# Patient Record
Sex: Male | Born: 1980 | Race: Black or African American | Hispanic: No | Marital: Single | Smoking: Current every day smoker
Health system: Southern US, Community
[De-identification: ages and names within clinical notes are randomized; demographics above are authoritative.]

---

## 2003-03-05 ENCOUNTER — Emergency Department (HOSPITAL_COMMUNITY): Admission: EM | Admit: 2003-03-05 | Discharge: 2003-03-05 | Payer: Self-pay | Admitting: *Deleted

## 2003-03-23 ENCOUNTER — Emergency Department (HOSPITAL_COMMUNITY): Admission: EM | Admit: 2003-03-23 | Discharge: 2003-03-23 | Payer: Self-pay | Admitting: Emergency Medicine

## 2009-07-10 ENCOUNTER — Emergency Department (HOSPITAL_COMMUNITY): Admission: EM | Admit: 2009-07-10 | Discharge: 2009-07-10 | Payer: Self-pay | Admitting: Emergency Medicine

## 2009-08-27 ENCOUNTER — Emergency Department (HOSPITAL_COMMUNITY): Admission: EM | Admit: 2009-08-27 | Discharge: 2009-08-27 | Payer: Self-pay | Admitting: Emergency Medicine

## 2010-02-09 ENCOUNTER — Emergency Department (HOSPITAL_COMMUNITY): Admission: EM | Admit: 2010-02-09 | Discharge: 2010-02-09 | Payer: Self-pay | Admitting: Emergency Medicine

## 2010-04-06 ENCOUNTER — Emergency Department (HOSPITAL_COMMUNITY): Admission: EM | Admit: 2010-04-06 | Discharge: 2010-04-07 | Payer: Self-pay | Admitting: Emergency Medicine

## 2010-07-29 IMAGING — CR DG FEMUR 2V*L*
4 series · 4 of 4 positions shown · non-contrast
Comparison: None.

CLINICAL DATA: Left leg pain.

LEFT FEMUR - 2 VIEW

[view not recorded (1 of 4)]
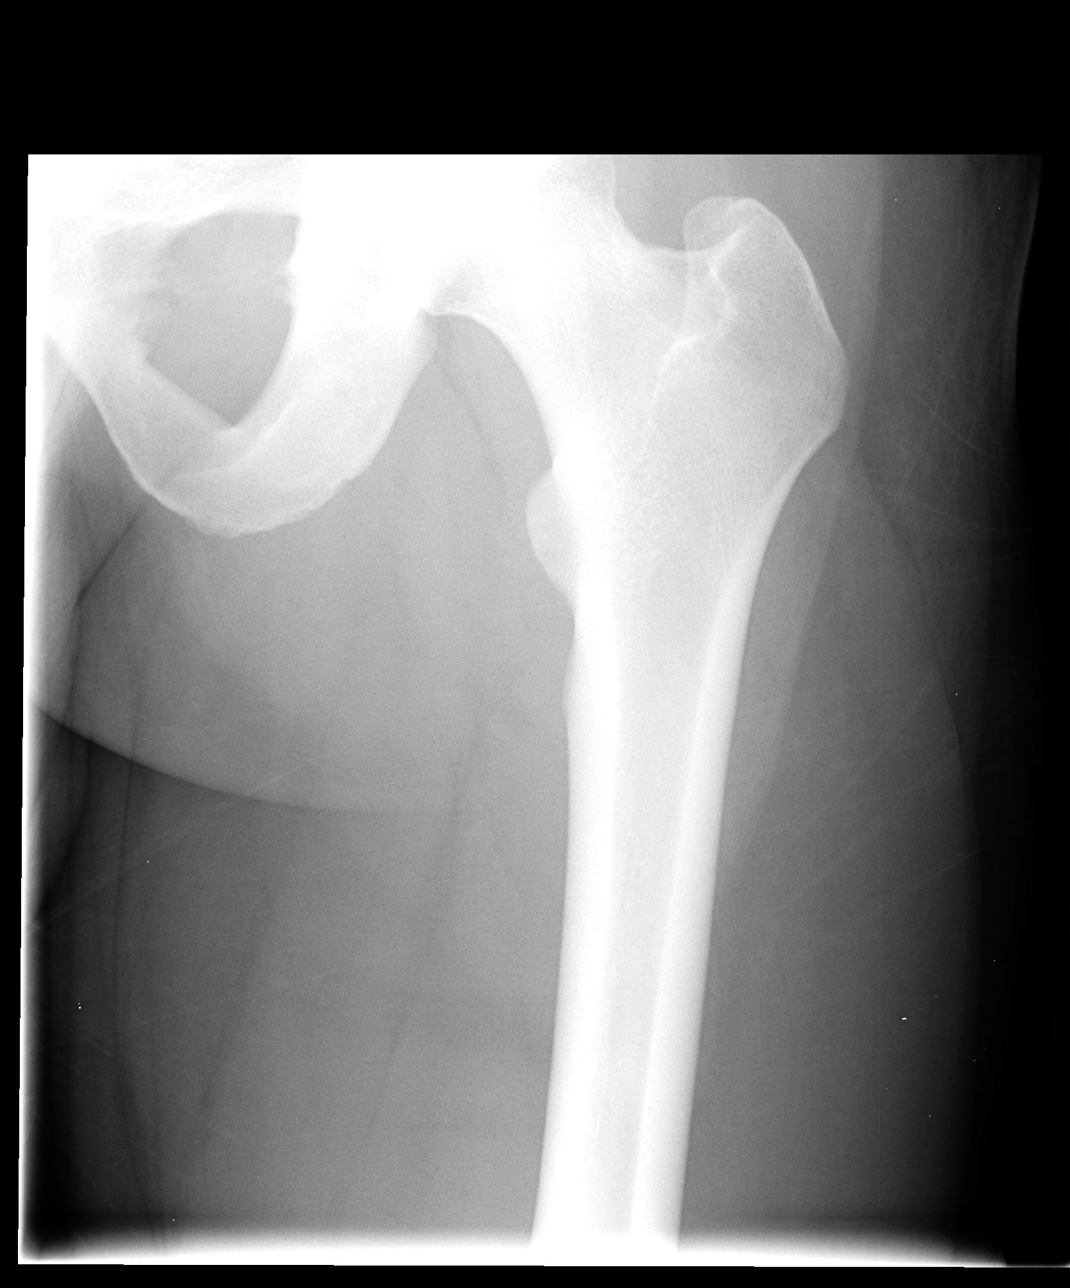

[view not recorded (2 of 4)]
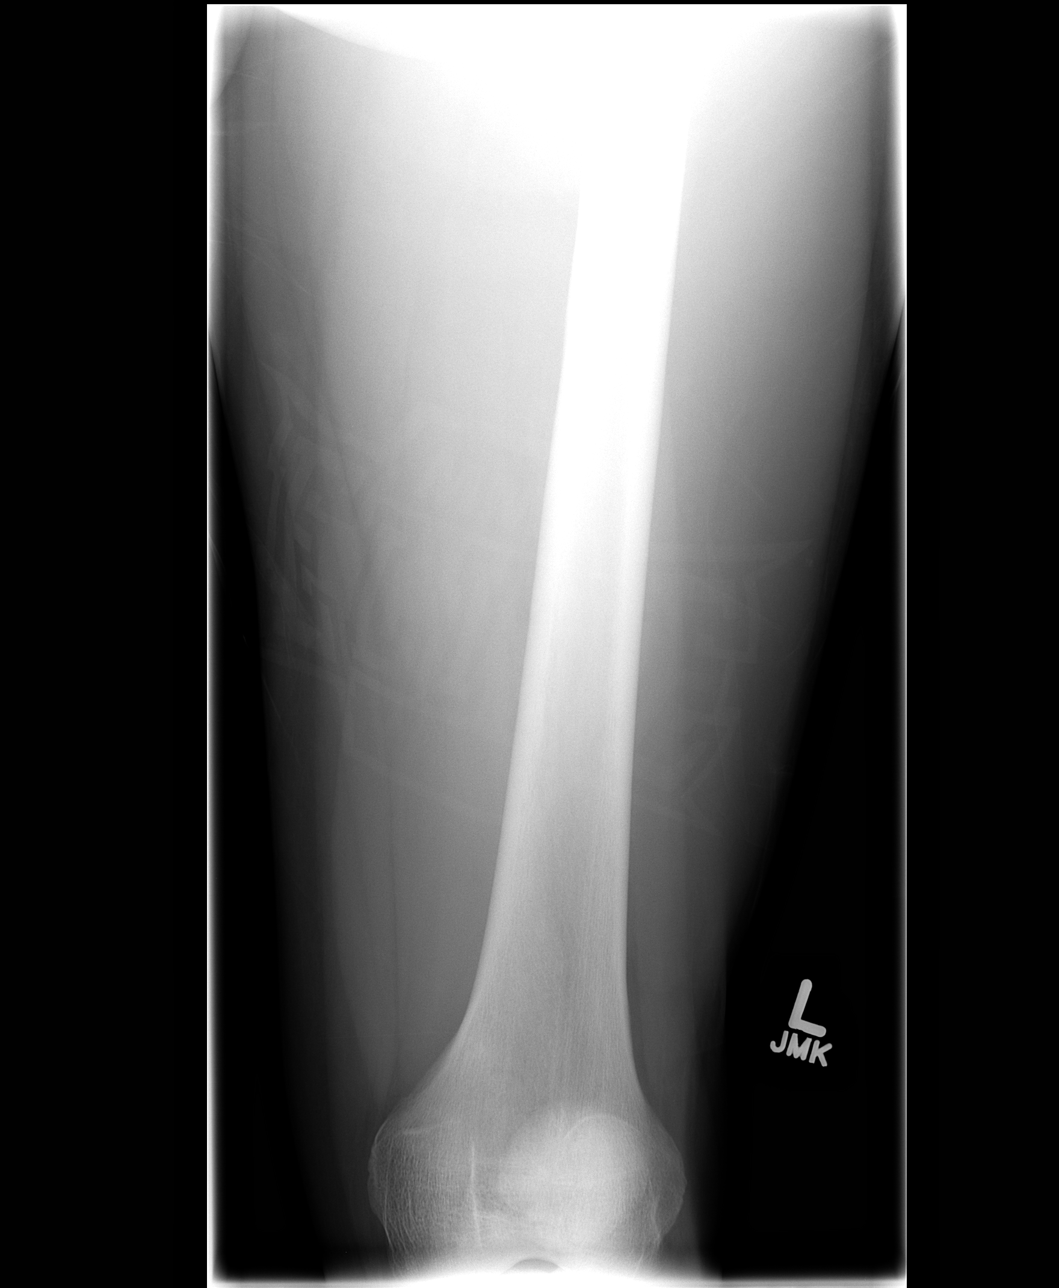

[view not recorded (3 of 4)]
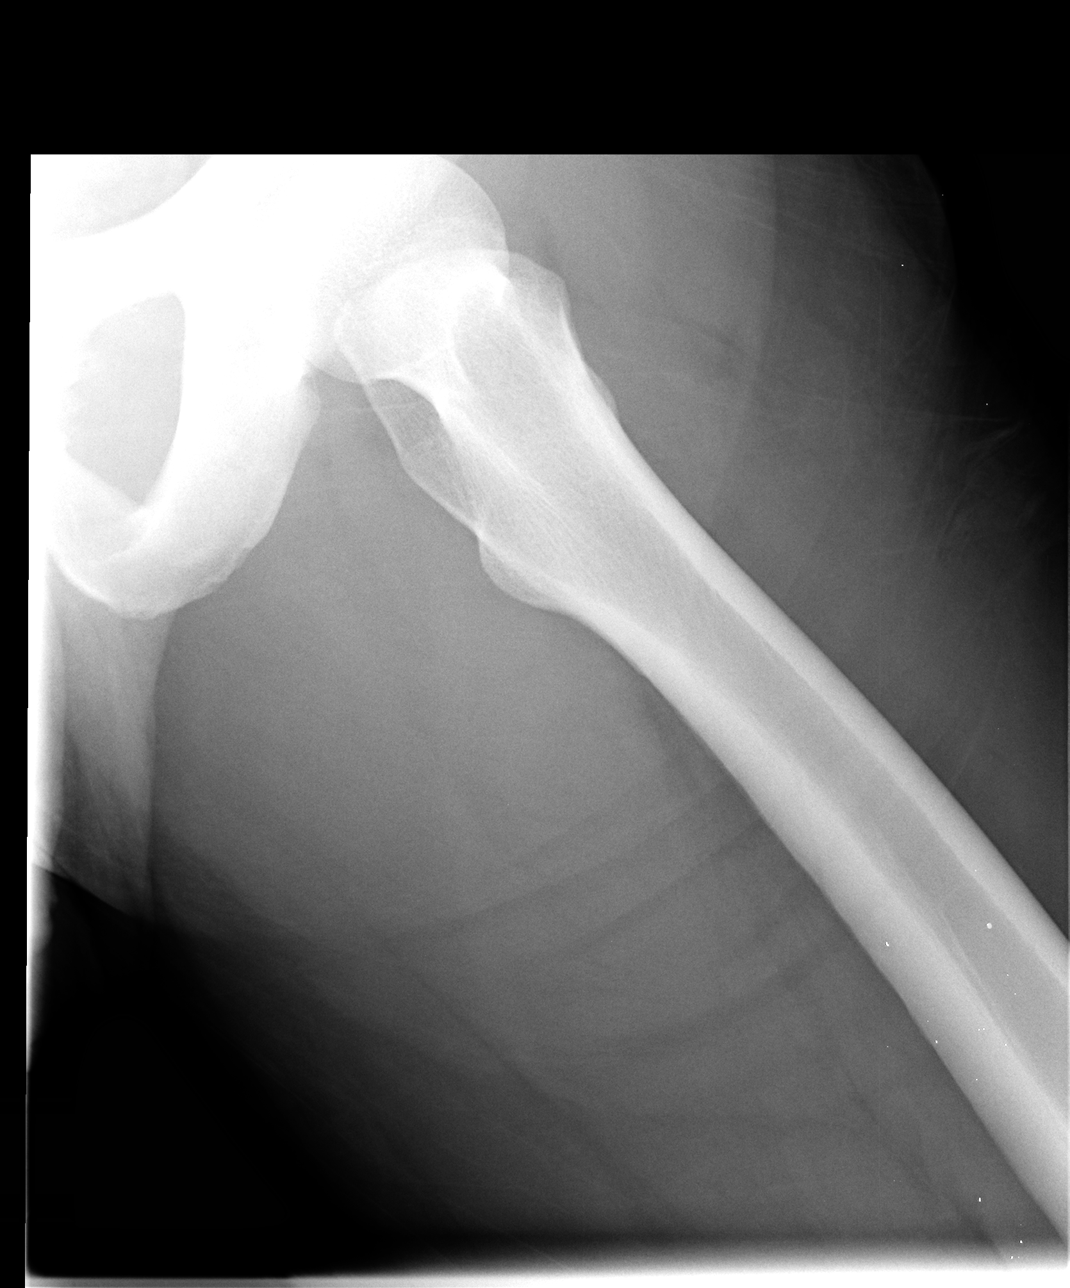

[view not recorded (4 of 4)]
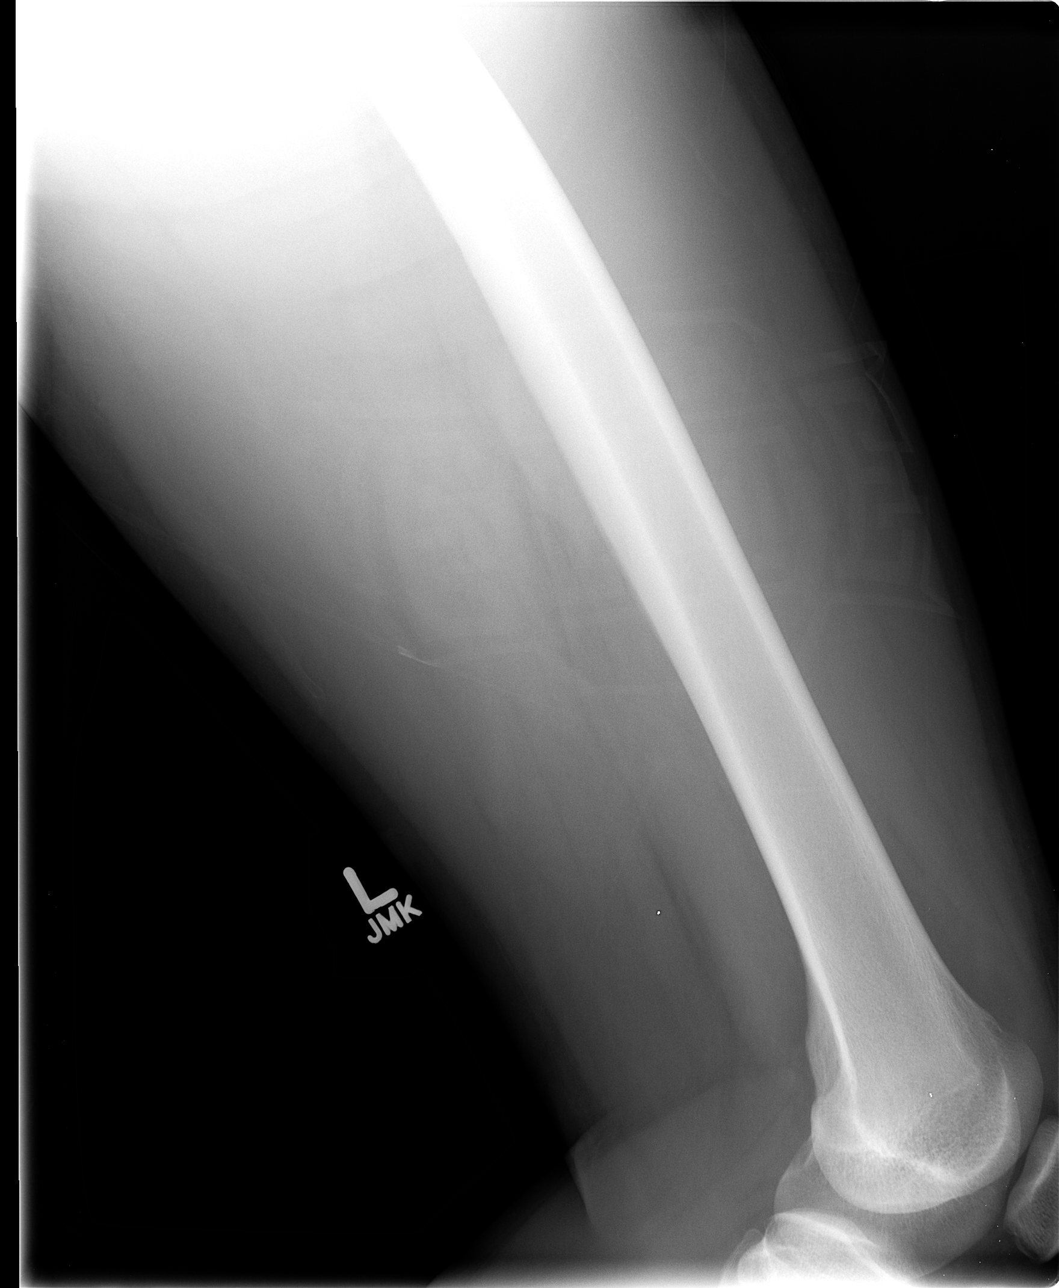

[4 of 4 positions shown; findings below may reference images not displayed]

FINDINGS: Imaged bones, joints and soft tissues appear normal.
IMPRESSION: Negative exam.

## 2010-12-31 ENCOUNTER — Emergency Department (HOSPITAL_COMMUNITY)
Admission: EM | Admit: 2010-12-31 | Discharge: 2011-01-01 | Payer: Self-pay | Source: Home / Self Care | Admitting: Emergency Medicine

## 2011-01-05 LAB — CBC
HCT: 42.8 % (ref 39.0–52.0)
Hemoglobin: 14.9 g/dL (ref 13.0–17.0)
MCH: 28 pg (ref 26.0–34.0)
MCHC: 34.8 g/dL (ref 30.0–36.0)
MCV: 80.3 fL (ref 78.0–100.0)
Platelets: 290 10*3/uL (ref 150–400)
RBC: 5.33 MIL/uL (ref 4.22–5.81)
RDW: 13 % (ref 11.5–15.5)
WBC: 4.5 10*3/uL (ref 4.0–10.5)

## 2011-01-05 LAB — DIFFERENTIAL
Basophils Absolute: 0 10*3/uL (ref 0.0–0.1)
Basophils Relative: 1 % (ref 0–1)
Eosinophils Absolute: 0.1 10*3/uL (ref 0.0–0.7)
Eosinophils Relative: 3 % (ref 0–5)
Lymphocytes Relative: 38 % (ref 12–46)

## 2011-01-05 LAB — RAPID URINE DRUG SCREEN, HOSP PERFORMED
Amphetamines: NOT DETECTED
Barbiturates: NOT DETECTED
Benzodiazepines: NOT DETECTED
Cocaine: NOT DETECTED
Opiates: NOT DETECTED
Tetrahydrocannabinol: NOT DETECTED

## 2011-01-05 LAB — BASIC METABOLIC PANEL
BUN: 16 mg/dL (ref 6–23)
Calcium: 9.4 mg/dL (ref 8.4–10.5)
Chloride: 103 mEq/L (ref 96–112)
Creatinine, Ser: 1.37 mg/dL (ref 0.4–1.5)
GFR calc non Af Amer: >60 mL/min (ref >60)

## 2011-01-05 LAB — URINALYSIS, ROUTINE W REFLEX MICROSCOPIC
Hgb urine dipstick: NEGATIVE
Protein, ur: NEGATIVE mg/dL
Specific Gravity, Urine: 1.025 (ref 1.005–1.030)
pH: 6 (ref 5.0–8.0)

## 2013-05-10 ENCOUNTER — Emergency Department (HOSPITAL_COMMUNITY)
Admission: EM | Admit: 2013-05-10 | Discharge: 2013-05-10 | Disposition: A | Discharge status: Home / Self Care | Payer: Self-pay | Attending: Emergency Medicine | Admitting: Emergency Medicine

## 2013-05-10 ENCOUNTER — Encounter (HOSPITAL_COMMUNITY): Payer: Self-pay | Admitting: Emergency Medicine

## 2013-05-10 DIAGNOSIS — Y9389 Activity, other specified: Secondary | ICD-10-CM | POA: Insufficient documentation

## 2013-05-10 DIAGNOSIS — R21 Rash and other nonspecific skin eruption: Secondary | ICD-10-CM | POA: Insufficient documentation

## 2013-05-10 DIAGNOSIS — R110 Nausea: Secondary | ICD-10-CM | POA: Insufficient documentation

## 2013-05-10 DIAGNOSIS — W57XXXA Bitten or stung by nonvenomous insect and other nonvenomous arthropods, initial encounter: Secondary | ICD-10-CM

## 2013-05-10 DIAGNOSIS — Z87891 Personal history of nicotine dependence: Secondary | ICD-10-CM | POA: Insufficient documentation

## 2013-05-10 DIAGNOSIS — IMO0001 Reserved for inherently not codable concepts without codable children: Secondary | ICD-10-CM | POA: Insufficient documentation

## 2013-05-10 DIAGNOSIS — S90569A Insect bite (nonvenomous), unspecified ankle, initial encounter: Secondary | ICD-10-CM | POA: Insufficient documentation

## 2013-05-10 DIAGNOSIS — Y929 Unspecified place or not applicable: Secondary | ICD-10-CM | POA: Insufficient documentation

## 2013-05-10 MED ORDER — DOXYCYCLINE HYCLATE 100 MG PO CAPS: 100.0000 mg | ORAL_CAPSULE | Freq: Two times a day (BID) | ORAL | Status: DC

## 2013-05-10 NOTE — ED Notes (Signed)
Pt c/o multiple bites to legs from spider, pt also c/o pulled off 3 ticks yesterday. Pt states he "feels funny". nad noted.

## 2013-05-14 NOTE — ED Provider Notes (Signed)
History     CSN: [id number]  Arrival date & time 05/10/13  1018   First MD Initiated Contact with Patient 05/10/13 1052      Chief Complaint  Patient presents with  . Insect Bite    (Consider location/radiation/quality/duration/timing/severity/associated sxs/prior treatment) HPI Comments: JUNG YURCHAK is a 32 y.o. male who presents to the Emergency Department complaining of  Several ticks bites to his lower legs and insect bites that he believes may have been spider bites.  He c/o generalized body aches and nausea.  He denies fever, abd pain, rash, vomiting or fever.  He has not applied any OTC creams or taken any medications  The history is provided by the patient.    History reviewed. No pertinent past medical history.  History reviewed. No pertinent past surgical history.  No family history on file.  History  Substance Use Topics  . Smoking status: Former Games developer  . Smokeless tobacco: Not on file  . Alcohol Use: No     Comment: rare      Review of Systems  Constitutional: Negative for fever, chills, activity change and appetite change.  HENT: Negative for sore throat, facial swelling, trouble swallowing, neck pain and neck stiffness.   Respiratory: Negative for chest tightness, shortness of breath and wheezing.   Gastrointestinal: Positive for nausea. Negative for vomiting.  Musculoskeletal: Positive for myalgias. Negative for joint swelling, arthralgias and gait problem.  Skin: Positive for rash. Negative for wound.  Neurological: Negative for dizziness, weakness, numbness and headaches.  All other systems reviewed and are negative.    Allergies  Review of patient's allergies indicates not on file.  Home Medications   Current Outpatient Rx  Name  Route  Sig  Dispense  Refill  . doxycycline (VIBRAMYCIN) 100 MG capsule   Oral   Take 1 capsule (100 mg total) by mouth 2 (two) times daily. For 14 days   28 capsule   0     BP 123/77  Pulse 76   Temp(Src) 98.6 F (37 C) (Oral)  Resp 16  Ht 5\' 11"  (1.803 m)  Wt 198 lb (89.812 kg)  BMI 27.63 kg/m2  SpO2 100%  Physical Exam  Nursing note and vitals reviewed. Constitutional: He is oriented to person, place, and time. He appears well-developed and well-nourished. No distress.  HENT:  Head: Normocephalic and atraumatic.  Mouth/Throat: Oropharynx is clear and moist.  Neck: Normal range of motion. Neck supple.  Cardiovascular: Normal rate, regular rhythm and normal heart sounds.   Pulmonary/Chest: Effort normal and breath sounds normal. No respiratory distress. He exhibits no tenderness.  Musculoskeletal: Normal range of motion. He exhibits no edema and no tenderness.  Lymphadenopathy:    He has no cervical adenopathy.  Neurological: He is alert and oriented to person, place, and time. He exhibits normal muscle tone. Coordination normal.  Skin: Rash noted. There is erythema.  Scattered Erythematous papules to the bilateral LE's.  No pustules or fluctuance.  No petechiae or vesicles    ED Course  Procedures (including critical care time)  Labs Reviewed - No data to display No results found.   1. Insect bite   2. Tick bite       MDM     Patient with hx of tick bite.  Denies rash, but c/o body aches and nausea at this time.  Non-toxic appearing   Will treat with doxy and he agrees to return here if sx's worsen       Dorthey Depace L.  Jerzie Bieri, PA-C 05/14/13 1418

## 2013-05-15 NOTE — ED Provider Notes (Signed)
Medical screening examination/treatment/procedure(s) were performed by non-physician practitioner and as supervising physician I was immediately available for consultation/collaboration.  Rhet Rorke, MD 05/15/13 1418 

## 2013-10-18 ENCOUNTER — Emergency Department (HOSPITAL_COMMUNITY)
Admission: EM | Admit: 2013-10-18 | Discharge: 2013-10-18 | Disposition: A | Discharge status: Home / Self Care | Payer: Self-pay | Attending: Emergency Medicine | Admitting: Emergency Medicine

## 2013-10-18 ENCOUNTER — Encounter (HOSPITAL_COMMUNITY): Payer: Self-pay | Admitting: Emergency Medicine

## 2013-10-18 ENCOUNTER — Telehealth: Payer: Self-pay | Admitting: Orthopedic Surgery

## 2013-10-18 ENCOUNTER — Emergency Department (HOSPITAL_COMMUNITY): Payer: Self-pay

## 2013-10-18 DIAGNOSIS — F17200 Nicotine dependence, unspecified, uncomplicated: Secondary | ICD-10-CM | POA: Insufficient documentation

## 2013-10-18 DIAGNOSIS — IMO0002 Reserved for concepts with insufficient information to code with codable children: Secondary | ICD-10-CM | POA: Insufficient documentation

## 2013-10-18 DIAGNOSIS — S63501A Unspecified sprain of right wrist, initial encounter: Secondary | ICD-10-CM

## 2013-10-18 DIAGNOSIS — W2209XA Striking against other stationary object, initial encounter: Secondary | ICD-10-CM | POA: Insufficient documentation

## 2013-10-18 DIAGNOSIS — Y939 Activity, unspecified: Secondary | ICD-10-CM | POA: Insufficient documentation

## 2013-10-18 DIAGNOSIS — Y929 Unspecified place or not applicable: Secondary | ICD-10-CM | POA: Insufficient documentation

## 2013-10-18 MED ORDER — IBUPROFEN 800 MG PO TABS: 800.0000 mg | ORAL_TABLET | Freq: Three times a day (TID) | ORAL | Status: DC

## 2013-10-18 MED ORDER — HYDROCODONE-ACETAMINOPHEN 5-325 MG PO TABS: ORAL_TABLET | ORAL | Status: DC

## 2013-10-18 NOTE — Telephone Encounter (Signed)
Dakota Everett called this morning to request an appointment for an [facility] ER follow-up. Said his injury is right forearm sprain.  He does not have any   Insurance so I advised him of the minimum amount ($125.00) needed for his first visit.  He said OK, he will call back to schedule

## 2013-10-18 NOTE — ED Notes (Signed)
Pt reports log rolling onto his right forearm last week, cont. To have pain and swelling to the area.

## 2013-10-18 NOTE — ED Notes (Signed)
Pt alert & oriented x4, stable gait. Patient given discharge instructions, paperwork & prescription(s). Patient  instructed to stop at the registration desk to finish any additional paperwork. Patient verbalized understanding. Pt left department w/ no further questions. 

## 2013-10-20 NOTE — ED Provider Notes (Signed)
CSN: [id number]     Arrival date & time 10/18/13  0830 History   First MD Initiated Contact with Patient 10/18/13 [phone]     Chief Complaint  Patient presents with  . Arm Injury   (Consider location/radiation/quality/duration/timing/severity/associated sxs/prior Treatment) Patient is a 32 y.o. male presenting with arm injury. The history is provided by the patient.  Arm Injury Location:  Arm Time since incident:  1 week Injury: yes   Mechanism of injury comment:  Direct blow from a log Arm location:  R forearm Pain details:    Quality:  Aching and throbbing   Radiates to:  Does not radiate   Severity:  Moderate   Onset quality:  Sudden   Timing:  Constant   Progression:  Unchanged Chronicity:  New Handedness:  Right-handed Dislocation: no   Foreign body present:  No foreign bodies Prior injury to area:  No Relieved by:  Rest and being still Worsened by:  Movement (gripping objects and supation of the arm) Ineffective treatments:  NSAIDs Associated symptoms: swelling   Associated symptoms: no decreased range of motion, no fever, no muscle weakness, no neck pain, no numbness, no stiffness and no tingling     History reviewed. No pertinent past medical history. History reviewed. No pertinent past surgical history. No family history on file. History  Substance Use Topics  . Smoking status: Current Every Day Smoker    Types: Cigarettes  . Smokeless tobacco: Not on file  . Alcohol Use: Yes     Comment: rare    Review of Systems  Constitutional: Negative for fever and chills.  Genitourinary: Negative for dysuria and difficulty urinating.  Musculoskeletal: Positive for arthralgias and joint swelling. Negative for neck pain and stiffness.       Right forearm pain  Skin: Negative for color change and wound.  All other systems reviewed and are negative.    Allergies  Review of patient's allergies indicates no known allergies.  Home Medications   Current Outpatient Rx    Name  Route  Sig  Dispense  Refill  . doxycycline (VIBRAMYCIN) 100 MG capsule   Oral   Take 1 capsule (100 mg total) by mouth 2 (two) times daily. For 14 days   28 capsule   0   . HYDROcodone-acetaminophen (NORCO/VICODIN) 5-325 MG per tablet      Take one-two tabs po q 4-6 hrs prn pain   15 tablet   0   . ibuprofen (ADVIL,MOTRIN) 800 MG tablet   Oral   Take 1 tablet (800 mg total) by mouth 3 (three) times daily.   21 tablet   0    BP 125/79  Pulse 62  Temp(Src) 98.1 F (36.7 C) (Oral)  Resp 20  Ht 5\' 11"  (1.803 m)  Wt 192 lb (87.091 kg)  BMI 26.79 kg/m2  SpO2 99% Physical Exam  Nursing note and vitals reviewed. Constitutional: He is oriented to person, place, and time. He appears well-developed and well-nourished. No distress.  HENT:  Head: Normocephalic and atraumatic.  Cardiovascular: Normal rate, regular rhythm and normal heart sounds.   Pulmonary/Chest: Effort normal and breath sounds normal.  Musculoskeletal: He exhibits edema and tenderness.       Right forearm: He exhibits tenderness and swelling. He exhibits no deformity and no laceration.       Arms: Localized  ttp of the distal right forearm.  Mild to moderate STS.  Radial pulse is brisk, distal sensation intact.  CR< 2 sec.  No bruising  or bony deformity.  Right elbow and wrist are NT. Compartments soft.  Neurological: He is alert and oriented to person, place, and time. He exhibits normal muscle tone. Coordination normal.  Skin: Skin is warm and dry.    ED Course  Procedures (including critical care time) Labs Review Labs Reviewed - No data to display Imaging Review Dg Forearm Right  10/18/2013   CLINICAL DATA:  Log fell on forearm, pain and swelling just above wrist  EXAM: RIGHT FOREARM - 2 VIEW  COMPARISON:  None  FINDINGS: Osseous mineralization normal.  Joint spaces preserved.  No fracture, dislocation, or bone destruction.  IMPRESSION: Normal exam.   Electronically Signed   By: Ulyses Southward M.D.    On: 10/18/2013 09:54    EKG Interpretation   None       MDM   1. Forearm sprain, right, initial encounter    Localized ttp of the right distal forearm.  No bony deformity.  NV intact.  Pt has full ROM of the fingers.  Neg XR.  Possible ligament injury vs muscle tear.  Compartments are soft.    Velcro forearm splint applied.  Pain improved.  Pt agrees to RICE therapy and close f/u with Dr. Romeo Apple.     Bowman Higbie L. Trisha Mangle, PA-C 10/20/13 1611

## 2013-10-20 NOTE — ED Provider Notes (Signed)
Medical screening examination/treatment/procedure(s) were performed by non-physician practitioner and as supervising physician I was immediately available for consultation/collaboration.  EKG Interpretation   None         Charles B. Sheldon, MD 10/20/13 2030 

## 2013-10-25 ENCOUNTER — Encounter: Payer: Self-pay | Admitting: Orthopedic Surgery

## 2013-11-01 ENCOUNTER — Ambulatory Visit (INDEPENDENT_AMBULATORY_CARE_PROVIDER_SITE_OTHER): Payer: Self-pay | Admitting: Orthopedic Surgery

## 2013-11-01 ENCOUNTER — Encounter: Payer: Self-pay | Admitting: Orthopedic Surgery

## 2013-11-01 VITALS — BP 122/84 | Ht 71.0 in | Wt 212.0 lb

## 2013-11-01 DIAGNOSIS — S40021A Contusion of right upper arm, initial encounter: Secondary | ICD-10-CM

## 2013-11-01 DIAGNOSIS — S40029A Contusion of unspecified upper arm, initial encounter: Secondary | ICD-10-CM

## 2013-11-01 NOTE — Patient Instructions (Signed)
Work note to return to work 11/02/13

## 2013-11-01 NOTE — Progress Notes (Signed)
Patient ID: Dakota Everett, male   DOB: December 09, 1981, 32 y.o.   MRN: [id number]  Chief Complaint  Patient presents with  . 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 But his right elbow forearm wrist and hand have normal range of motion stability and strength no neurovascular deficits  Impression contusion right forearm  Followup as needed return to work tomorrow.

## 2014-04-16 ENCOUNTER — Emergency Department (HOSPITAL_COMMUNITY)
Admission: EM | Admit: 2014-04-16 | Discharge: 2014-04-16 | Disposition: A | Discharge status: Home / Self Care | Payer: Self-pay | Attending: Emergency Medicine | Admitting: Emergency Medicine

## 2014-04-16 ENCOUNTER — Encounter (HOSPITAL_COMMUNITY): Payer: Self-pay | Admitting: Emergency Medicine

## 2014-04-16 ENCOUNTER — Emergency Department (HOSPITAL_COMMUNITY): Payer: Self-pay

## 2014-04-16 DIAGNOSIS — S92153A Displaced avulsion fracture (chip fracture) of unspecified talus, initial encounter for closed fracture: Secondary | ICD-10-CM

## 2014-04-16 DIAGNOSIS — Z791 Long term (current) use of non-steroidal anti-inflammatories (NSAID): Secondary | ICD-10-CM | POA: Insufficient documentation

## 2014-04-16 DIAGNOSIS — Y92838 Other recreation area as the place of occurrence of the external cause: Secondary | ICD-10-CM

## 2014-04-16 DIAGNOSIS — S93609A Unspecified sprain of unspecified foot, initial encounter: Secondary | ICD-10-CM | POA: Insufficient documentation

## 2014-04-16 DIAGNOSIS — F17200 Nicotine dependence, unspecified, uncomplicated: Secondary | ICD-10-CM | POA: Insufficient documentation

## 2014-04-16 DIAGNOSIS — Z792 Long term (current) use of antibiotics: Secondary | ICD-10-CM | POA: Insufficient documentation

## 2014-04-16 DIAGNOSIS — S93601A Unspecified sprain of right foot, initial encounter: Secondary | ICD-10-CM

## 2014-04-16 DIAGNOSIS — Y9239 Other specified sports and athletic area as the place of occurrence of the external cause: Secondary | ICD-10-CM | POA: Insufficient documentation

## 2014-04-16 DIAGNOSIS — S92109B Unspecified fracture of unspecified talus, initial encounter for open fracture: Secondary | ICD-10-CM | POA: Insufficient documentation

## 2014-04-16 DIAGNOSIS — W1801XA Striking against sports equipment with subsequent fall, initial encounter: Secondary | ICD-10-CM | POA: Insufficient documentation

## 2014-04-16 DIAGNOSIS — Y9361 Activity, american tackle football: Secondary | ICD-10-CM | POA: Insufficient documentation

## 2014-04-16 MED ORDER — TRAMADOL HCL 50 MG PO TABS: 100.0000 mg | ORAL_TABLET | Freq: Four times a day (QID) | ORAL | Status: DC | PRN

## 2014-04-16 MED ORDER — IBUPROFEN 800 MG PO TABS
800.0000 mg | ORAL_TABLET | Freq: Once | ORAL | Status: AC
  Administered 2014-04-16: 800 mg via ORAL
  Filled 2014-04-16: qty 1

## 2014-04-16 NOTE — Discharge Instructions (Signed)
Elevate your foot. Use ice packs for pain and to get the swelling down. Take ibuprofen 600 mg + acetaminophen 1000 mg 4 times a day for pain with the tramadol. Use the crutches until you are able to walk on your foot. Use the ankle support and shoe to support your foot and ankle. Call Dr Sanjuan DameKeeling's office to have him recheck your foot later this week.

## 2014-04-16 NOTE — ED Notes (Signed)
CSM intact after application of ACE and ASO.

## 2014-04-16 NOTE — ED Provider Notes (Signed)
CSN: [id number]     Arrival date & time 04/16/14  0729 History   This chart was scribed for Ward GivensIva L Mrytle Bento, MD by Ladona Ridgelaylor Day, ED scribe. This patient was seen in room APA17/APA17 and the patient's care was started at 0729.  Chief Complaint  Patient presents with  . Ankle Pain   The history is provided by the patient. No language interpreter was used.   HPI Comments: Dakota Everett is a 33 y.o. male who presents to the Emergency Department for sudden onset left foot pain after he injured it while playing football yesterday in a league. He states he was running and ran into another playing head on. The other player was knocked unconscious. He had some pain in his right foot after the fall. But he states woke up this morning and tried to go to work but heard a crack in his foot when he tried walking and the pain was too bad to continue walking.  He states cannot do ROM in his ankle because of pain. He localizes pain to dorsal aspect of his foot. He denies previous foot/ankle injuries.    He has no PCP   He smokes one black&mild per day  History reviewed. No pertinent past medical history. History reviewed. No pertinent past surgical history. No family history on file. History  Substance Use Topics  . Smoking status: Current Every Day Smoker    Types: Cigarettes  . Smokeless tobacco: Not on file  . Alcohol Use: Yes     Comment: rare  employed Smokes 1 cigar a day  Review of Systems  Constitutional: Negative for fever and chills.  Respiratory: Negative for cough and shortness of breath.   Cardiovascular: Negative for chest pain.  Gastrointestinal: Negative for abdominal pain.  Musculoskeletal: Negative for back pain.       Right foot pain  All other systems reviewed and are negative.  Allergies  Review of patient's allergies indicates no known allergies.  Home Medications   Prior to Admission medications   Medication Sig Start Date End Date Taking? Authorizing Provider   doxycycline (VIBRAMYCIN) 100 MG capsule Take 1 capsule (100 mg total) by mouth 2 (two) times daily. For 14 days 05/10/13   Tammy L. Triplett, PA-C  HYDROcodone-acetaminophen (NORCO/VICODIN) 5-325 MG per tablet Take one-two tabs po q 4-6 hrs prn pain 10/18/13   Tammy L. Triplett, PA-C  ibuprofen (ADVIL,MOTRIN) 800 MG tablet Take 1 tablet (800 mg total) by mouth 3 (three) times daily. 10/18/13   Tammy L. Triplett, PA-C   Triage Vitals: BP 142/80  Pulse 88  Resp 16  Ht 5\' 11"  (1.803 m)  Wt 216 lb (97.977 kg)  BMI 30.14 kg/m2  SpO2 99%  Vital signs normal    Physical Exam  Nursing note and vitals reviewed. Constitutional: He is oriented to person, place, and time. He appears well-developed and well-nourished. No distress.  HENT:  Head: Normocephalic and atraumatic.  Right Ear: External ear normal.  Left Ear: External ear normal.  Eyes: Conjunctivae are normal. Pupils are equal, round, and reactive to light. Right eye exhibits no discharge. Left eye exhibits no discharge.  Neck: Normal range of motion. Neck supple.  Pulmonary/Chest: Effort normal. No respiratory distress.  Musculoskeletal: Normal range of motion. He exhibits edema and tenderness.  Non tender over the malleoli of his right ankle both medially and laterally but does have round area of swelling/redness over the proximal foot of the base of the MTs of the little and  fourth toe. See Photo. Good distal pulses, sensation intact. States he is unable to move his toes b/o pain  Neurological: He is alert and oriented to person, place, and time.  Skin: Skin is warm and dry.  Psychiatric: He has a normal mood and affect. Thought content normal.      ED Course  Procedures (including critical care time)  Medications  ibuprofen (ADVIL,MOTRIN) tablet 800 mg (800 mg Oral Given 04/16/14 0833)    DIAGNOSTIC STUDIES: Oxygen Saturation is 99% on room air, normal by my interpretation.    COORDINATION OF CARE: At 820 AM Discussed  treatment plan with patient which includes right ankle/foot X-ray, ibuprofen, apply ice and ace wrap. Patient agrees.   Patient placed in a ASO and postop shoe. He was put on crutches. 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 He was given a work note for limited duty. On review of his prior visits patient has seen Dr. Romeo AppleHarrison in the past.  Labs Review Labs Reviewed - No data to display  Imaging Review Dg Ankle Complete Right  04/16/2014   CLINICAL DATA:  Ankle pain.  Redness and swelling.  EXAM: RIGHT ANKLE - COMPLETE 3+ VIEW  COMPARISON:  07/10/2009  FINDINGS: There is no evidence of acute fracture or dislocation. Joint spaces are preserved. No lytic or blastic osseous lesion is seen. No soft tissue abnormality is identified.  IMPRESSION: Negative.   Electronically Signed   By: Sebastian AcheAllen  Grady   On: 04/16/2014 08:01   Dg Foot Complete Right  04/16/2014   CLINICAL DATA:  Right foot and ankle pain.  EXAM: RIGHT FOOT COMPLETE - 3+ VIEW  COMPARISON:  04/16/2014 ankle  FINDINGS: The alignment of the foot is normal. There is a small ossification or bone fragment along the dorsal and lateral aspect of the talus. Findings could represent a small avulsion injury. No other areas are suspicious for a fracture. Soft tissues are prominent along the dorsal aspect of the foot.  IMPRESSION: Concern for a small bone fragment and avulsion injury along the dorsal and lateral aspect of the talus. Recommend clinical correlation in this area.   Electronically Signed   By: Richarda OverlieAdam  Henn M.D.   On: 04/16/2014 08:04     EKG Interpretation None      MDM   Final diagnoses:  Sprain of foot, right  Avulsion fracture of talus    Discharge Medication List as of 04/16/2014  8:41 AM    START taking these medications   Details  traMADol (ULTRAM) 50 MG tablet Take 2 tablets (100 mg total) by mouth every 6 (six)  hours as needed., Starting 04/16/2014, Until Discontinued, Print        Plan discharge   Devoria AlbeIva Christien Berthelot, MD, FACEP   I personally performed the services described in this documentation, which was scribed in my presence. The recorded information has been reviewed and considered.  Devoria AlbeIva Delsie Amador, MD, FACEP     Ward GivensIva L Domanick Cuccia, MD 04/16/14 [phone]

## 2014-04-16 NOTE — ED Notes (Signed)
Rt ankle/foot pain after playing football yesterday and reports other team player cleat went into pt foot. Redness and swelling noted to rt foot/ankle. Pt reports unable to move last 3 toes on right foot and unable to bear weight. Pt reports hearing a "crack" last night

## 2014-08-12 ENCOUNTER — Encounter (HOSPITAL_COMMUNITY): Payer: Self-pay | Admitting: Emergency Medicine

## 2014-08-12 ENCOUNTER — Emergency Department (HOSPITAL_COMMUNITY)
Admission: EM | Admit: 2014-08-12 | Discharge: 2014-08-12 | Disposition: A | Discharge status: Home / Self Care | Payer: Self-pay | Attending: Emergency Medicine | Admitting: Emergency Medicine

## 2014-08-12 DIAGNOSIS — S39012A Strain of muscle, fascia and tendon of lower back, initial encounter: Secondary | ICD-10-CM

## 2014-08-12 DIAGNOSIS — M545 Low back pain, unspecified: Secondary | ICD-10-CM | POA: Insufficient documentation

## 2014-08-12 DIAGNOSIS — F17200 Nicotine dependence, unspecified, uncomplicated: Secondary | ICD-10-CM | POA: Insufficient documentation

## 2014-08-12 DIAGNOSIS — M62830 Muscle spasm of back: Secondary | ICD-10-CM

## 2014-08-12 DIAGNOSIS — M62838 Other muscle spasm: Secondary | ICD-10-CM | POA: Insufficient documentation

## 2014-08-12 MED ORDER — HYDROCODONE-ACETAMINOPHEN 5-325 MG PO TABS: 1.0000 | ORAL_TABLET | ORAL | Status: DC | PRN

## 2014-08-12 MED ORDER — HYDROCODONE-ACETAMINOPHEN 5-325 MG PO TABS
1.0000 | ORAL_TABLET | Freq: Once | ORAL | Status: AC
  Administered 2014-08-12: 1 via ORAL
  Filled 2014-08-12: qty 1

## 2014-08-12 MED ORDER — NAPROXEN 500 MG PO TABS: 500.0000 mg | ORAL_TABLET | Freq: Two times a day (BID) | ORAL | Status: AC

## 2014-08-12 MED ORDER — CYCLOBENZAPRINE HCL 10 MG PO TABS
10.0000 mg | ORAL_TABLET | Freq: Once | ORAL | Status: AC
  Administered 2014-08-12: 10 mg via ORAL
  Filled 2014-08-12: qty 1

## 2014-08-12 MED ORDER — CYCLOBENZAPRINE HCL 10 MG PO TABS: 10.0000 mg | ORAL_TABLET | Freq: Two times a day (BID) | ORAL | Status: DC | PRN

## 2014-08-12 NOTE — ED Provider Notes (Signed)
CSN: [id number]     Arrival date & time 08/12/14  2140 History   First MD Initiated Contact with Patient 08/12/14 2227     Chief Complaint  Patient presents with  . Back Pain     (Consider location/radiation/quality/duration/timing/severity/associated sxs/prior Treatment) Patient is a 33 y.o. male presenting with back pain. The history is provided by the patient.  Back Pain Location:  Lumbar spine Quality:  Aching and shooting Pain severity:  Severe Onset quality:  Sudden Duration:  4 days Timing:  Constant Progression:  Worsening Chronicity:  New Context: physical stress   Relieved by:  Nothing Worsened by:  Movement, ambulation and bending Ineffective treatments:  Ibuprofen Associated symptoms: no bladder incontinence and no bowel incontinence    Dakota Everett is a 33 y.o. male who presents to the ED with low back pain. The pain started while he was at work and picking up a 100 pound piece of equipment.he felt a pop in his lower back and felt a wave of nausea. He went to Urgent Care when the injury occurred and was treated with muscle relaxant and told he could take ibuprofen. The patient has continue to have pain and has been unable to work. He went in but had to leave due to the pain.   History reviewed. No pertinent past medical history. History reviewed. No pertinent past surgical history. No family history on file. History  Substance Use Topics  . Smoking status: Current Every Day Smoker    Types: Cigarettes, Cigars  . Smokeless tobacco: Not on file  . Alcohol Use: Yes     Comment: rare    Review of Systems  Gastrointestinal: Negative for bowel incontinence.  Genitourinary: Negative for bladder incontinence.  Musculoskeletal: Positive for back pain.  all other systems negative    Allergies  Other and Tomato  Home Medications   Prior to Admission medications   Medication Sig Start Date End Date Taking? Authorizing Provider  Multiple Vitamin  (MULTIVITAMIN WITH MINERALS) TABS tablet Take 1 tablet by mouth daily.   Yes Historical Provider, MD   BP 141/94  Pulse 64  Temp(Src) 99.3 F (37.4 C)  Resp 20  Ht  (1.803 m)  Wt 196 lb (88.905 kg)  BMI 27.35 kg/m2  SpO2 100% Physical Exam  Nursing note and vitals reviewed. Constitutional: He is oriented to person, place, and time. He appears well-developed and well-nourished. No distress.  HENT:  Head: Normocephalic and atraumatic.  Eyes: EOM are normal. Pupils are equal, round, and reactive to light.  Neck: Normal range of motion. Neck supple.  Cardiovascular: Normal rate and regular rhythm.   Pulmonary/Chest: Effort normal. No respiratory distress. He has no wheezes. He has no rales.  Abdominal: Soft. Bowel sounds are normal. There is no tenderness.  Musculoskeletal: Normal range of motion. He exhibits no edema.       Lumbar back: He exhibits tenderness and spasm. He exhibits normal range of motion, no deformity and normal pulse.       Back:  Neurological: He is alert and oriented to person, place, and time. He has normal strength. No cranial nerve deficit or sensory deficit. Coordination and gait normal.  Reflex Scores:      Bicep reflexes are 2+ on the right side and 2+ on the left side.      Brachioradialis reflexes are 2+ on the right side and 2+ on the left side.      Patellar reflexes are 2+ on the right side and  2+ on the left side.      Achilles reflexes are 2+ on the right side and 2+ on the left side. Skin: Skin is warm and dry.  Psychiatric: He has a normal mood and affect. His behavior is normal.    ED Course  Procedures   MDM  [age]y.o. male with low back pain s/p injury 4 days ago. Stable for discharge without neuro deficits. Will treat for pain and muscle spasm. He will follow up with ortho. Discussed with the patient and all questioned fully answered. He will return if any problems arise.    Medication List    TAKE these medications        cyclobenzaprine 10 MG tablet  Commonly known as:  FLEXERIL  Take 1 tablet (10 mg total) by mouth 2 (two) times daily as needed for muscle spasms.     HYDROcodone-acetaminophen 5-325 MG per tablet  Commonly known as:  NORCO/VICODIN  Take 1 tablet by mouth every 4 (four) hours as needed.     naproxen 500 MG tablet  Commonly known as:  NAPROSYN  Take 1 tablet (500 mg total) by mouth 2 (two) times daily.      ASK your doctor about these medications       multivitamin with minerals Tabs tablet  Take 1 tablet by mouth daily.            [name] Orlene Och, NP 08/13/14 0100

## 2014-08-12 NOTE — ED Notes (Signed)
Pt lifted a piece of equipment at work that weighed about 100 lbs and pt felt a pop in his back. Pt was seen at an urgent care and was told that he had a strained a muscle in his back. Pt went back to work and then left work early due to the pain. Pt states he is still in pain and hasn't been able to return to work.

## 2014-08-13 NOTE — ED Provider Notes (Signed)
Medical screening examination/treatment/procedure(s) were performed by non-physician practitioner and as supervising physician I was immediately available for consultation/collaboration.   Dione Booze, MD 08/13/14 [phone]

## 2014-10-22 ENCOUNTER — Encounter (HOSPITAL_COMMUNITY): Payer: Self-pay | Admitting: *Deleted

## 2014-10-22 ENCOUNTER — Emergency Department (HOSPITAL_COMMUNITY)
Admission: EM | Admit: 2014-10-22 | Discharge: 2014-10-22 | Disposition: A | Discharge status: Home / Self Care | Payer: Self-pay | Attending: Emergency Medicine | Admitting: Emergency Medicine

## 2014-10-22 ENCOUNTER — Emergency Department (HOSPITAL_COMMUNITY): Payer: Self-pay

## 2014-10-22 DIAGNOSIS — R0602 Shortness of breath: Secondary | ICD-10-CM

## 2014-10-22 DIAGNOSIS — R197 Diarrhea, unspecified: Secondary | ICD-10-CM | POA: Insufficient documentation

## 2014-10-22 DIAGNOSIS — Z791 Long term (current) use of non-steroidal anti-inflammatories (NSAID): Secondary | ICD-10-CM | POA: Insufficient documentation

## 2014-10-22 DIAGNOSIS — Z720 Tobacco use: Secondary | ICD-10-CM | POA: Insufficient documentation

## 2014-10-22 DIAGNOSIS — Z79899 Other long term (current) drug therapy: Secondary | ICD-10-CM | POA: Insufficient documentation

## 2014-10-22 DIAGNOSIS — R1110 Vomiting, unspecified: Secondary | ICD-10-CM | POA: Insufficient documentation

## 2014-10-22 DIAGNOSIS — B349 Viral infection, unspecified: Secondary | ICD-10-CM | POA: Insufficient documentation

## 2014-10-22 MED ORDER — ONDANSETRON 8 MG PO TBDP: ORAL_TABLET | ORAL | Status: AC

## 2014-10-22 MED ORDER — METOCLOPRAMIDE HCL 10 MG PO TABS: 10.0000 mg | ORAL_TABLET | Freq: Four times a day (QID) | ORAL | Status: AC | PRN

## 2014-10-22 MED ORDER — LOPERAMIDE HCL 2 MG PO CAPS: ORAL_CAPSULE | ORAL | Status: AC

## 2014-10-22 MED ORDER — LOPERAMIDE HCL 2 MG PO CAPS
4.0000 mg | ORAL_CAPSULE | Freq: Once | ORAL | Status: AC
  Administered 2014-10-22: 4 mg via ORAL
  Filled 2014-10-22: qty 2

## 2014-10-22 NOTE — ED Notes (Addendum)
Fever, vomiting, diarrhea, cough,  Sore throat.Feels sob at times

## 2014-10-22 NOTE — Discharge Instructions (Signed)
You appear to have an upper respiratory infection (URI). An upper respiratory tract infection, or cold, is a viral infection of the air passages leading to the lungs. It is contagious and can be spread to others, especially during the first 3 or 4 days. It cannot be cured by antibiotics or other medicines. °RETURN IMMEDIATELY IF you develop shortness of breath, confusion or altered mental status, a new rash, become dizzy, faint, or poorly responsive, or are unable to be cared for at home. ° °

## 2014-10-22 NOTE — ED Provider Notes (Signed)
CSN: [id number]     Arrival date & time 10/22/14  1945 History   First MD Initiated Contact with Patient 10/22/14 2101     Chief Complaint  Patient presents with  . Shortness of Breath     (Consider location/radiation/quality/duration/timing/severity/associated sxs/prior Treatment) HPI 33 year old male needs a note to return to work. He states 5 days ago he had fever and started having nasal congestion with cough and vomiting with diarrhea all of that is much improved. He has minimal cough now no shortness of breath now no nasal congestion now but still vomited twice today and had several nonbloody loose stools today without abdominal pain without fever today without confusion without shortness of breath and has been tolerating oral fluids and wants to return to work tomorrow. Treatment prior to arrival consisted of over-the-counter cold medicines with improvement. History reviewed. No pertinent past medical history. History reviewed. No pertinent past surgical history. History reviewed. No pertinent family history. History  Substance Use Topics  . Smoking status: Current Every Day Smoker    Types: Cigarettes, Cigars  . Smokeless tobacco: Not on file  . Alcohol Use: Yes     Comment: rare    Review of Systems 10 Systems reviewed and are negative for acute change except as noted in the HPI.   Allergies  Other and Tomato  Home Medications   Prior to Admission medications   Medication Sig Start Date End Date Taking? Authorizing Provider  cyclobenzaprine (FLEXERIL) 10 MG tablet Take 1 tablet (10 mg total) by mouth 2 (two) times daily as needed for muscle spasms. 08/12/14   Hope Orlene OchM Neese, NP  HYDROcodone-acetaminophen (NORCO/VICODIN) 5-325 MG per tablet Take 1 tablet by mouth every 4 (four) hours as needed. 08/12/14   Hope Orlene OchM Neese, NP  loperamide (IMODIUM) 2 MG capsule Take two tabs po initially, then one tab after each loose stool: max 8 tabs in 24 hours 10/22/14   Hurman HornJohn M Quill Grinder, MD   metoCLOPramide (REGLAN) 10 MG tablet Take 1 tablet (10 mg total) by mouth every 6 (six) hours as needed for nausea (nausea/headache). 10/22/14   Hurman HornJohn M Kanyon Bunn, MD  Multiple Vitamin (MULTIVITAMIN WITH MINERALS) TABS tablet Take 1 tablet by mouth daily.    Historical Provider, MD  naproxen (NAPROSYN) 500 MG tablet Take 1 tablet (500 mg total) by mouth 2 (two) times daily. 08/12/14   Hope Orlene OchM Neese, NP  ondansetron (ZOFRAN ODT) 8 MG disintegrating tablet 8mg  ODT q4 hours prn nausea 10/22/14   Hurman HornJohn M Jameil Whitmoyer, MD   BP 129/79 mmHg  Pulse 68  Temp(Src) 97.9 F (36.6 C) (Oral)  Resp 20  Ht 5\' 11"  (1.803 m)  Wt 193 lb (87.544 kg)  BMI 26.93 kg/m2  SpO2 100% Physical Exam  Constitutional:  Awake, alert, nontoxic appearance.  HENT:  Head: Atraumatic.  Mouth/Throat: Oropharynx is clear and moist. No oropharyngeal exudate.  Eyes: Right eye exhibits no discharge. Left eye exhibits no discharge.  Neck: Neck supple.  Cardiovascular: Normal rate and regular rhythm.   No murmur heard. Pulmonary/Chest: Effort normal. No respiratory distress. He has no wheezes. He has no rales. He exhibits no tenderness.  Pulse oximetry normal room air 100%  Abdominal: Soft. Bowel sounds are normal. He exhibits no distension and no mass. There is no tenderness. There is no rebound and no guarding.  Musculoskeletal: He exhibits no tenderness.  Baseline ROM, no obvious new focal weakness.  Neurological: He is alert.  Mental status and motor strength appears baseline for patient  and situation.  Skin: No rash noted.  Psychiatric: He has a normal mood and affect.  Nursing note and vitals reviewed.   ED Course  Procedures (including critical care time) Labs Review Labs Reviewed - No data to display  Imaging Review No results found.   EKG Interpretation None      MDM   Final diagnoses:  SOB (shortness of breath)  Acute viral syndrome    Patient / Family / Caregiver informed of clinical course, understand  medical decision-making process, and agree with plan. I doubt any other EMC precluding discharge at this time including, but not necessarily limited to the following:SBI.    Hurman HornJohn M Mahitha Hickling, MD 11/08/14 [phone]

## 2014-11-25 ENCOUNTER — Encounter (HOSPITAL_COMMUNITY): Payer: Self-pay | Admitting: *Deleted

## 2014-11-25 ENCOUNTER — Emergency Department (HOSPITAL_COMMUNITY)
Admission: EM | Admit: 2014-11-25 | Discharge: 2014-11-26 | Disposition: A | Discharge status: Home / Self Care | Payer: BC Managed Care – PPO | Attending: Emergency Medicine | Admitting: Emergency Medicine

## 2014-11-25 DIAGNOSIS — Y9289 Other specified places as the place of occurrence of the external cause: Secondary | ICD-10-CM | POA: Diagnosis not present

## 2014-11-25 DIAGNOSIS — Y9389 Activity, other specified: Secondary | ICD-10-CM | POA: Diagnosis not present

## 2014-11-25 DIAGNOSIS — X58XXXA Exposure to other specified factors, initial encounter: Secondary | ICD-10-CM | POA: Diagnosis not present

## 2014-11-25 DIAGNOSIS — Y998 Other external cause status: Secondary | ICD-10-CM | POA: Diagnosis not present

## 2014-11-25 DIAGNOSIS — S39012A Strain of muscle, fascia and tendon of lower back, initial encounter: Secondary | ICD-10-CM | POA: Diagnosis not present

## 2014-11-25 DIAGNOSIS — Z791 Long term (current) use of non-steroidal anti-inflammatories (NSAID): Secondary | ICD-10-CM | POA: Diagnosis not present

## 2014-11-25 DIAGNOSIS — Z720 Tobacco use: Secondary | ICD-10-CM | POA: Diagnosis not present

## 2014-11-25 DIAGNOSIS — M545 Low back pain: Secondary | ICD-10-CM

## 2014-11-25 DIAGNOSIS — Z79899 Other long term (current) drug therapy: Secondary | ICD-10-CM | POA: Insufficient documentation

## 2014-11-25 DIAGNOSIS — T148XXA Other injury of unspecified body region, initial encounter: Secondary | ICD-10-CM

## 2014-11-25 DIAGNOSIS — S3992XA Unspecified injury of lower back, initial encounter: Secondary | ICD-10-CM | POA: Diagnosis present

## 2014-11-25 NOTE — ED Provider Notes (Signed)
CSN: [id number]     Arrival date & time 11/25/14  2247 History   First MD Initiated Contact with Patient 11/25/14 2335     Chief Complaint  Patient presents with  . Back Pain     (Consider location/radiation/quality/duration/timing/severity/associated sxs/prior Treatment) Patient is a 33 y.o. male presenting with back pain. The history is provided by the patient.  Back Pain Location:  Lumbar spine Quality:  Aching, shooting and cramping Pain severity:  Moderate Pain is:  Same all the time Onset quality:  Gradual Duration:  5 hours Timing:  Intermittent Progression:  Worsening Chronicity:  New Context: lifting heavy objects   Relieved by:  Nothing Worsened by:  Nothing tried Ineffective treatments:  None tried Associated symptoms: no abdominal pain, no bladder incontinence, no bowel incontinence, no chest pain, no dysuria and no perianal numbness   Risk factors: no recent surgery and no vascular disease     History reviewed. No pertinent past medical history. History reviewed. No pertinent past surgical history. History reviewed. No pertinent family history. History  Substance Use Topics  . Smoking status: Current Every Day Smoker    Types: Cigarettes, Cigars  . Smokeless tobacco: Not on file  . Alcohol Use: Yes     Comment: rare    Review of Systems  Constitutional: Negative for activity change.       All ROS Neg except as noted in HPI  Eyes: Negative for photophobia and discharge.  Respiratory: Negative for cough, shortness of breath and wheezing.   Cardiovascular: Negative for chest pain and palpitations.  Gastrointestinal: Negative for abdominal pain, blood in stool and bowel incontinence.  Genitourinary: Negative for bladder incontinence, dysuria, frequency and hematuria.  Musculoskeletal: Positive for back pain. Negative for arthralgias and neck pain.  Skin: Negative.   Neurological: Negative for dizziness, seizures and speech difficulty.   Psychiatric/Behavioral: Negative for hallucinations and confusion.      Allergies  Other and Tomato  Home Medications   Prior to Admission medications   Medication Sig Start Date End Date Taking? Authorizing Provider  cyclobenzaprine (FLEXERIL) 10 MG tablet Take 1 tablet (10 mg total) by mouth 2 (two) times daily as needed for muscle spasms. 08/12/14   Hope Orlene OchM Neese, NP  HYDROcodone-acetaminophen (NORCO/VICODIN) 5-325 MG per tablet Take 1 tablet by mouth every 4 (four) hours as needed. 08/12/14   Hope Orlene OchM Neese, NP  loperamide (IMODIUM) 2 MG capsule Take two tabs po initially, then one tab after each loose stool: max 8 tabs in 24 hours 10/22/14   Hurman HornJohn M Bednar, MD  metoCLOPramide (REGLAN) 10 MG tablet Take 1 tablet (10 mg total) by mouth every 6 (six) hours as needed for nausea (nausea/headache). 10/22/14   Hurman HornJohn M Bednar, MD  Multiple Vitamin (MULTIVITAMIN WITH MINERALS) TABS tablet Take 1 tablet by mouth daily.    Historical Provider, MD  naproxen (NAPROSYN) 500 MG tablet Take 1 tablet (500 mg total) by mouth 2 (two) times daily. 08/12/14   Hope Orlene OchM Neese, NP  ondansetron (ZOFRAN ODT) 8 MG disintegrating tablet 8mg  ODT q4 hours prn nausea 10/22/14   Hurman HornJohn M Bednar, MD   BP 139/84 mmHg  Pulse 97  Temp(Src) 98.4 F (36.9 C) (Oral)  Resp 16  Ht 5\' 11"  (1.803 m)  Wt 193 lb 6.4 oz (87.726 kg)  BMI 26.99 kg/m2  SpO2 100% Physical Exam  Constitutional: He is oriented to person, place, and time. He appears well-developed and well-nourished.  Non-toxic appearance.  HENT:  Head: Normocephalic.  Right Ear: Tympanic membrane and external ear normal.  Left Ear: Tympanic membrane and external ear normal.  Eyes: EOM and lids are normal. Pupils are equal, round, and reactive to light.  Neck: Normal range of motion. Neck supple. Carotid bruit is not present.  Cardiovascular: Normal rate, regular rhythm, normal heart sounds, intact distal pulses and normal pulses.   Pulmonary/Chest: Breath sounds normal.  No respiratory distress.  Abdominal: Soft. Bowel sounds are normal. There is no tenderness. There is no guarding.  Musculoskeletal:       Lumbar back: He exhibits decreased range of motion, tenderness, pain and spasm.  Lymphadenopathy:       Head (right side): No submandibular adenopathy present.       Head (left side): No submandibular adenopathy present.    He has no cervical adenopathy.  Neurological: He is alert and oriented to person, place, and time. He has normal strength. No cranial nerve deficit or sensory deficit.  Skin: Skin is warm and dry.  Psychiatric: He has a normal mood and affect. His speech is normal.  Nursing note and vitals reviewed.   ED Course  Procedures (including critical care time) Labs Review Labs Reviewed - No data to display  Imaging Review No results found.   EKG Interpretation None      MDM No gross neuro deficit noted.  Exam supports Muscle strain Rx for norco and robaxin given to the patient. Pt to f/u with primary MD for recheck and follow up.   Final diagnoses:  Muscle strain  Low back pain, unspecified back pain laterality, with sciatica presence unspecified    *I have reviewed nursing notes, vital signs, and all appropriate lab and imaging results for this patient.Kathie Dike*    TRUE Garciamartinez M [place] Perra, PA-C 11/27/14 1631  Dione Boozeavid Glick, MD 11/30/14 [phone]

## 2014-11-25 NOTE — ED Notes (Signed)
Pt c/o lower back pain. Pt states he was carrying bricks about 4 hrs ago.

## 2014-11-26 MED ORDER — METHOCARBAMOL 500 MG PO TABS: 500.0000 mg | ORAL_TABLET | Freq: Three times a day (TID) | ORAL | Status: AC

## 2014-11-26 MED ORDER — HYDROCODONE-ACETAMINOPHEN 5-325 MG PO TABS: 1.0000 | ORAL_TABLET | ORAL | Status: DC | PRN

## 2014-11-26 MED ORDER — METHOCARBAMOL 500 MG PO TABS
1000.0000 mg | ORAL_TABLET | Freq: Once | ORAL | Status: AC
  Administered 2014-11-26: 1000 mg via ORAL
  Filled 2014-11-26: qty 2

## 2014-11-26 MED ORDER — HYDROCODONE-ACETAMINOPHEN 5-325 MG PO TABS
2.0000 | ORAL_TABLET | Freq: Once | ORAL | Status: AC
  Administered 2014-11-26: 2 via ORAL
  Filled 2014-11-26: qty 2

## 2014-11-26 NOTE — Discharge Instructions (Signed)
Muscle Strain °A muscle strain (pulled muscle) happens when a muscle is stretched beyond normal length. It happens when a sudden, violent force stretches your muscle too far. Usually, a few of the fibers in your muscle are torn. Muscle strain is common in athletes. Recovery usually takes 1-2 weeks. Complete healing takes 5-6 weeks.  °HOME CARE  °· Follow the PRICE method of treatment to help your injury get better. Do this the first 2-3 days after the injury: °¨ Protect. Protect the muscle to keep it from getting injured again. °¨ Rest. Limit your activity and rest the injured body part. °¨ Ice. Put ice in a plastic bag. Place a towel between your skin and the bag. Then, apply the ice and leave it on from 15-20 minutes each hour. After the third day, switch to moist heat packs. °¨ Compression. Use a splint or elastic bandage on the injured area for comfort. Do not put it on too tightly. °¨ Elevate. Keep the injured body part above the level of your heart. °· Only take medicine as told by your doctor. °· Warm up before doing exercise to prevent future muscle strains. °GET HELP IF:  °· You have more pain or puffiness (swelling) in the injured area. °· You feel numbness, tingling, or notice a loss of strength in the injured area. °MAKE SURE YOU:  °· Understand these instructions. °· Will watch your condition. °· Will get help right away if you are not doing well or get worse. °Document Released: 09/08/2008 Document Revised: 09/20/2013 Document Reviewed: 06/29/2013 °[employer]® Patient Information ©2015 [employer]. This information is not intended to replace advice given to you by your health care provider. Make sure you discuss any questions you have with your health care provider. ° °Back Pain, Adult °Back pain is very common. The pain often gets better over time. The cause of back pain is usually not dangerous. Most people can learn to manage their back pain on their own.  °HOME CARE  °· Stay active. Start with  short walks on flat ground if you can. Try to walk farther each day. °· Do not sit, drive, or stand in one place for more than 30 minutes. Do not stay in bed. °· Do not avoid exercise or work. Activity can help your back heal faster. °· Be careful when you bend or lift an object. Bend at your knees, keep the object close to you, and do not twist. °· Sleep on a firm mattress. Lie on your side, and bend your knees. If you lie on your back, put a pillow under your knees. °· Only take medicines as told by your doctor. °· Put ice on the injured area. °¨ Put ice in a plastic bag. °¨ Place a towel between your skin and the bag. °¨ Leave the ice on for 15-20 minutes, 03-04 times a day for the first 2 to 3 days. After that, you can switch between ice and heat packs. °· Ask your doctor about back exercises or massage. °· Avoid feeling anxious or stressed. Find good ways to deal with stress, such as exercise. °GET HELP RIGHT AWAY IF:  °· Your pain does not go away with rest or medicine. °· Your pain does not go away in 1 week. °· You have new problems. °· You do not feel well. °· The pain spreads into your legs. °· You cannot control when you poop (bowel movement) or pee (urinate). °· Your arms or legs feel weak or lose feeling (numbness). °·   You feel sick to your stomach (nauseous) or throw up (vomit). °· You have belly (abdominal) pain. °· You feel like you may pass out (faint). °MAKE SURE YOU:  °· Understand these instructions. °· Will watch your condition. °· Will get help right away if you are not doing well or get worse. °Document Released: 05/18/2008 Document Revised: 02/22/2012 Document Reviewed: 04/03/2014 °[employer]® Patient Information ©2015 [employer]. This information is not intended to replace advice given to you by your health care provider. Make sure you discuss any questions you have with your health care provider. ° °

## 2014-12-26 ENCOUNTER — Encounter (HOSPITAL_COMMUNITY): Payer: Self-pay

## 2014-12-26 ENCOUNTER — Emergency Department (HOSPITAL_COMMUNITY)
Admission: EM | Admit: 2014-12-26 | Discharge: 2014-12-27 | Disposition: A | Discharge status: Home / Self Care | Payer: Self-pay | Attending: Emergency Medicine | Admitting: Emergency Medicine

## 2014-12-26 DIAGNOSIS — Z79899 Other long term (current) drug therapy: Secondary | ICD-10-CM | POA: Insufficient documentation

## 2014-12-26 DIAGNOSIS — R197 Diarrhea, unspecified: Secondary | ICD-10-CM | POA: Insufficient documentation

## 2014-12-26 DIAGNOSIS — Z720 Tobacco use: Secondary | ICD-10-CM | POA: Insufficient documentation

## 2014-12-26 DIAGNOSIS — Z792 Long term (current) use of antibiotics: Secondary | ICD-10-CM | POA: Insufficient documentation

## 2014-12-26 DIAGNOSIS — R1013 Epigastric pain: Secondary | ICD-10-CM | POA: Insufficient documentation

## 2014-12-26 DIAGNOSIS — Z791 Long term (current) use of non-steroidal anti-inflammatories (NSAID): Secondary | ICD-10-CM | POA: Insufficient documentation

## 2014-12-26 DIAGNOSIS — R112 Nausea with vomiting, unspecified: Secondary | ICD-10-CM | POA: Insufficient documentation

## 2014-12-26 NOTE — ED Provider Notes (Signed)
CSN: [id number]     Arrival date & time 12/26/14  2327 History  This chart was scribed for Ward Givens, MD by Gwenyth Ober, ED Scribe. This patient was seen in room [facility]06[facility] and the patient's care was started at 11:53 PM.    Chief Complaint  Patient presents with  . Emesis  . Diarrhea   The history is provided by the patient. No language interpreter was used.    HPI Comments: Dakota Everett is a 34 y.o. male who presents to the Emergency Department complaining of 3-4 episodes of vomiting and gradually improving, watery diarrhea that was occuring hourly and now every 2 hours that all started this morning. He notes intermittent upper aching abdominal pain as an associated symptom. Pt denies abnormal food intake, but states several positive sick contacts with similar symptoms at work. He smokes 2 cigars weekly. Pt is currently taking amoxicillin for his wisdom teeth and only has 4 doses left. He denies dizziness, lightheadedness, increased thirst, dry tongue and blood in his stool as associated symptoms.  History reviewed. No pertinent past medical history. History reviewed. No pertinent past surgical history. No family history on file. History  Substance Use Topics  . Smoking status: Current Every Day Smoker    Types: Cigarettes, Cigars  . Smokeless tobacco: Not on file  . Alcohol Use: Yes     Comment: rare  employed in [employer] 2 cigars a week  Review of Systems  Gastrointestinal: Positive for vomiting, abdominal pain and diarrhea. Negative for blood in stool.  Neurological: Negative for dizziness and light-headedness.  All other systems reviewed and are negative.  Allergies  Other and Tomato  Home Medications   Prior to Admission medications   Medication Sig Start Date End Date Taking? Authorizing Provider  amoxicillin (AMOXIL) 500 MG capsule Take 500 mg by mouth 3 (three) times daily.   Yes Historical Provider, MD  cyclobenzaprine (FLEXERIL) 10 MG tablet Take  1 tablet (10 mg total) by mouth 2 (two) times daily as needed for muscle spasms. 08/12/14   Hope Orlene Och, NP  HYDROcodone-acetaminophen (NORCO/VICODIN) 5-325 MG per tablet Take 1 tablet by mouth every 4 (four) hours as needed. 11/26/14   Kathie Dike, PA-C  loperamide (IMODIUM) 2 MG capsule Take two tabs po initially, then one tab after each loose stool: max 8 tabs in 24 hours 10/22/14   Hurman Horn, MD  methocarbamol (ROBAXIN) 500 MG tablet Take 1 tablet (500 mg total) by mouth 3 (three) times daily. 11/26/14   Kathie Dike, PA-C  metoCLOPramide (REGLAN) 10 MG tablet Take 1 tablet (10 mg total) by mouth every 6 (six) hours as needed for nausea (nausea/headache). 10/22/14   Hurman Horn, MD  Multiple Vitamin (MULTIVITAMIN WITH MINERALS) TABS tablet Take 1 tablet by mouth daily.    Historical Provider, MD  naproxen (NAPROSYN) 500 MG tablet Take 1 tablet (500 mg total) by mouth 2 (two) times daily. 08/12/14   Hope Orlene Och, NP  ondansetron (ZOFRAN ODT) 8 MG disintegrating tablet  ODT q4 hours prn nausea 10/22/14   Hurman Horn, MD   BP 134/73 mmHg  Pulse 99  Temp(Src) 98.1 F (36.7 C) (Oral)  Resp 18  Ht  (1.803 m)  Wt 196 lb (88.905 kg)  BMI 27.35 kg/m2  SpO2 100%  Vital signs normal except borderline tachycardia  Physical Exam  Constitutional: He is oriented to person, place, and time. He appears well-developed and well-nourished.  Non-toxic appearance.  He does not appear ill. No distress.  HENT:  Head: Normocephalic and atraumatic.  Right Ear: External ear normal.  Left Ear: External ear normal.  Nose: Nose normal. No mucosal edema or rhinorrhea.  Mouth/Throat: Oropharynx is clear and moist and mucous membranes are normal. No dental abscesses or uvula swelling.  Eyes: Conjunctivae and EOM are normal. Pupils are equal, round, and reactive to light.  Neck: Normal range of motion and full passive range of motion without pain. Neck supple.  Cardiovascular: Normal rate,  regular rhythm and normal heart sounds.  Exam reveals no gallop and no friction rub.   No murmur heard. Pulmonary/Chest: Effort normal and breath sounds normal. No respiratory distress. He has no wheezes. He has no rhonchi. He has no rales. He exhibits no tenderness and no crepitus.  Abdominal: Soft. Normal appearance and bowel sounds are normal. He exhibits no distension. There is tenderness. There is no rebound and no guarding.    Mild Tenderness epigastric; hypoactive bowel sounds diffusely  Musculoskeletal: Normal range of motion. He exhibits no edema or tenderness.  Moves all extremities well.   Neurological: He is alert and oriented to person, place, and time. He has normal strength. No cranial nerve deficit.  Skin: Skin is warm, dry and intact. No rash noted. No erythema. No pallor.  Psychiatric: He has a normal mood and affect. His speech is normal and behavior is normal. His mood appears not anxious.  Nursing note and vitals reviewed.   ED Course  Procedures (including critical care time)  Medications  0.9 %  sodium chloride infusion (0 mLs Intravenous Stopped 12/27/14 0038)    Followed by  0.9 %  sodium chloride infusion (not administered)    Followed by  0.9 %  sodium chloride infusion (not administered)  ondansetron (ZOFRAN-ODT) disintegrating tablet 8 mg (not administered)  metoCLOPramide (REGLAN) injection 10 mg (10 mg Intravenous Given 12/27/14 0020)  diphenhydrAMINE (BENADRYL) injection 25 mg (25 mg Intravenous Given 12/27/14 0020)  loperamide (IMODIUM) capsule 4 mg (4 mg Oral Given 12/27/14 0044)    DIAGNOSTIC STUDIES: Oxygen Saturation is 100% on RA, normal by my interpretation.    COORDINATION OF CARE: 11:58 PM Discussed treatment plan with pt which includes lab work. Pt agreed to plan.  Although patient has been on an antibiotic, he reports 6 people at work are out b/o GI illness, most likely to be a viral etiology.   Recheck 01:30 pt had nurses take out his IV.  States he has some nausea still. Feels ready to be discharged however.   Labs Review Results for orders placed or performed during the hospital encounter of 12/26/14  Comprehensive metabolic panel  Result Value Ref Range   Sodium 136 135 - 145 mmol/L   Potassium 3.7 3.5 - 5.1 mmol/L   Chloride 103 96 - 112 mEq/L   CO2 26 19 - 32 mmol/L   Glucose, Bld 96 70 - 99 mg/dL   BUN 18 6 - 23 mg/dL   Creatinine, Ser 0.981.25 0.50 - 1.35 mg/dL   Calcium 9.4 8.4 - 11.910.5 mg/dL   Total Protein 7.3 6.0 - 8.3 g/dL   Albumin 4.6 3.5 - 5.2 g/dL   AST 32 0 - 37 U/L   ALT 38 0 - 53 U/L   Alkaline Phosphatase 46 39 - 117 U/L   Total Bilirubin 0.3 0.3 - 1.2 mg/dL   GFR calc non Af Amer 74 (L) >90 mL/min   GFR calc Af Amer 86 (L) >90 mL/min  Anion gap 7 5 - 15  Lipase, blood  Result Value Ref Range   Lipase 22 11 - 59 U/L   Laboratory interpretation all normal      Imaging Review No results found.   EKG Interpretation None      MDM   Final diagnoses:  Nausea vomiting and diarrhea    New Prescriptions   ONDANSETRON (ZOFRAN) 4 MG TABLET    Take 1 tablet (4 mg total) by mouth every 8 (eight) hours as needed.    Plan discharge   I personally performed the services described in this documentation, which was scribed in my presence. The recorded information has been reviewed and considered.  Devoria Albe, MD, FACEP    Ward Givens, MD 12/27/14 [phone]

## 2014-12-26 NOTE — ED Notes (Signed)
Pt c/o vomiting and diarrhea that started today, states he has vomited x 4 and having some diarrhea

## 2014-12-27 LAB — COMPREHENSIVE METABOLIC PANEL
ALT: 38 U/L (ref 0–53)
ANION GAP: 7 (ref 5–15)
AST: 32 U/L (ref 0–37)
Albumin: 4.6 g/dL (ref 3.5–5.2)
Alkaline Phosphatase: 46 U/L (ref 39–117)
BILIRUBIN TOTAL: 0.3 mg/dL (ref 0.3–1.2)
BUN: 18 mg/dL (ref 6–23)
CALCIUM: 9.4 mg/dL (ref 8.4–10.5)
CO2: 26 mmol/L (ref 19–32)
Chloride: 103 mEq/L (ref 96–112)
Creatinine, Ser: 1.25 mg/dL (ref 0.50–1.35)
GFR calc Af Amer: 86 mL/min — ABNORMAL LOW (ref >90)
GFR calc non Af Amer: 74 mL/min — ABNORMAL LOW (ref >90)
Glucose, Bld: 96 mg/dL (ref 70–99)
POTASSIUM: 3.7 mmol/L (ref 3.5–5.1)
Sodium: 136 mmol/L (ref 135–145)
TOTAL PROTEIN: 7.3 g/dL (ref 6.0–8.3)

## 2014-12-27 LAB — LIPASE, BLOOD: LIPASE: 22 U/L (ref 11–59)

## 2014-12-27 MED ORDER — ONDANSETRON HCL 4 MG PO TABS: 4.0000 mg | ORAL_TABLET | Freq: Three times a day (TID) | ORAL | Status: AC | PRN

## 2014-12-27 MED ORDER — SODIUM CHLORIDE 0.9 % IV SOLN: 1000.0000 mL | INTRAVENOUS | Status: DC

## 2014-12-27 MED ORDER — DIPHENHYDRAMINE HCL 50 MG/ML IJ SOLN
25.0000 mg | Freq: Once | INTRAMUSCULAR | Status: AC
  Administered 2014-12-27: 25 mg via INTRAVENOUS
  Filled 2014-12-27: qty 1

## 2014-12-27 MED ORDER — METOCLOPRAMIDE HCL 5 MG/ML IJ SOLN
10.0000 mg | Freq: Once | INTRAMUSCULAR | Status: AC
  Administered 2014-12-27: 10 mg via INTRAVENOUS
  Filled 2014-12-27: qty 2

## 2014-12-27 MED ORDER — ONDANSETRON 8 MG PO TBDP
8.0000 mg | ORAL_TABLET | Freq: Once | ORAL | Status: AC
  Administered 2014-12-27: 8 mg via ORAL
  Filled 2014-12-27: qty 1

## 2014-12-27 MED ORDER — SODIUM CHLORIDE 0.9 % IV SOLN
1000.0000 mL | Freq: Once | INTRAVENOUS | Status: AC
  Administered 2014-12-27: 1000 mL via INTRAVENOUS

## 2014-12-27 MED ORDER — LOPERAMIDE HCL 2 MG PO CAPS
4.0000 mg | ORAL_CAPSULE | Freq: Once | ORAL | Status: AC
  Administered 2014-12-27: 4 mg via ORAL
  Filled 2014-12-27: qty 2

## 2014-12-27 MED ORDER — SODIUM CHLORIDE 0.9 % IV SOLN: 1000.0000 mL | Freq: Once | INTRAVENOUS | Status: DC

## 2014-12-27 NOTE — Discharge Instructions (Signed)
Drink plenty of fluids, like sports drinks, so you don't get dehydrated. Avoid milk until the diarrhea is gone. Use the zofran for nausea or vomiting. Take imodium OTC for diarrhea. Recheck if you get worse again.    Diarrhea Diarrhea is watery poop (stool). It can make you feel weak, tired, thirsty, or give you a dry mouth (signs of dehydration). Watery poop is a sign of another problem, most often an infection. It often lasts 2-3 days. It can last longer if it is a sign of something serious. Take care of yourself as told by your doctor. HOME CARE   Drink 1 cup (8 ounces) of fluid each time you have watery poop.  Do not drink the following fluids:  Those that contain simple sugars (fructose, glucose, galactose, lactose, sucrose, maltose).  Sports drinks.  Fruit juices.  Whole milk products.  Sodas.  Drinks with caffeine (coffee, tea, soda) or alcohol.  Oral rehydration solution may be used if the doctor says it is okay. You may make your own solution. Follow this recipe:   - teaspoon table salt.   teaspoon baking soda.   teaspoon salt substitute containing potassium chloride.  1 tablespoons sugar.  1 liter (34 ounces) of water.  Avoid the following foods:  High fiber foods, such as raw fruits and vegetables.  Nuts, seeds, and whole grain breads and cereals.   Those that are sweetened with sugar alcohols (xylitol, sorbitol, mannitol).  Try eating the following foods:  Starchy foods, such as rice, toast, pasta, low-sugar cereal, oatmeal, baked potatoes, crackers, and bagels.  Bananas.  Applesauce.  Eat probiotic-rich foods, such as yogurt and milk products that are fermented.  Wash your hands well after each time you have watery poop.  Only take medicine as told by your doctor.  Take a warm bath to help lessen burning or pain from having watery poop. GET HELP RIGHT AWAY IF:   You cannot drink fluids without throwing up (vomiting).  You keep throwing  up.  You have blood in your poop, or your poop looks black and tarry.  You do not pee (urinate) in 6-8 hours, or there is only a small amount of very dark pee.  You have belly (abdominal) pain that gets worse or stays in the same spot (localizes).  You are weak, dizzy, confused, or light-headed.  You have a very bad headache.  Your watery poop gets worse or does not get better.  You have a fever or lasting symptoms for more than 2-3 days.  You have a fever and your symptoms suddenly get worse. MAKE SURE YOU:   Understand these instructions.  Will watch your condition.  Will get help right away if you are not doing well or get worse. Document Released: 05/18/2008 Document Revised: 04/16/2014 Document Reviewed: 08/07/2012 [facility] Patient Information 2015 [place], [place]. This information is not intended to replace advice given to you by your health care provider. Make sure you discuss any questions you have with your health care provider.  Nausea and Vomiting Nausea means you feel sick to your stomach. Throwing up (vomiting) is a reflex where stomach contents come out of your mouth. HOME CARE   Take medicine as told by your doctor.  Do not force yourself to eat. However, you do need to drink fluids.  If you feel like eating, eat a normal diet as told by your doctor.  Eat rice, wheat, potatoes, bread, lean meats, yogurt, fruits, and vegetables.  Avoid high-fat foods.  Drink enough  fluids to keep your pee (urine) clear or pale yellow.  Ask your doctor how to replace body fluid losses (rehydrate). Signs of body fluid loss (dehydration) include:  Feeling very thirsty.  Dry lips and mouth.  Feeling dizzy.  Dark pee.  Peeing less than normal.  Feeling confused.  Fast breathing or heart rate. GET HELP RIGHT AWAY IF:   You have blood in your throw up.  You have black or bloody poop (stool).  You have a bad headache or stiff neck.  You feel confused.  You  have bad belly (abdominal) pain.  You have chest pain or trouble breathing.  You do not pee at least once every 8 hours.  You have cold, clammy skin.  You keep throwing up after 24 to 48 hours.  You have a fever. MAKE SURE YOU:   Understand these instructions.  Will watch your condition.  Will get help right away if you are not doing well or get worse. Document Released: 05/18/2008 Document Revised: 02/22/2012 Document Reviewed: 05/01/2011 [facility] Patient Information 2015 [place]. This information is not intended to replace advice given to you by your health care provider. Make sure you discuss any questions you have with your health care provider.

## 2014-12-27 NOTE — ED Notes (Signed)
Patient states that he had a panic attack the last time he had an IV placed. Patient states that he wants the IV removed.

## 2015-02-10 ENCOUNTER — Emergency Department (HOSPITAL_COMMUNITY)
Admission: EM | Admit: 2015-02-10 | Discharge: 2015-02-10 | Disposition: A | Discharge status: Home / Self Care | Payer: Self-pay | Attending: Emergency Medicine | Admitting: Emergency Medicine

## 2015-02-10 ENCOUNTER — Encounter (HOSPITAL_COMMUNITY): Payer: Self-pay | Admitting: Emergency Medicine

## 2015-02-10 DIAGNOSIS — X58XXXA Exposure to other specified factors, initial encounter: Secondary | ICD-10-CM | POA: Insufficient documentation

## 2015-02-10 DIAGNOSIS — Z792 Long term (current) use of antibiotics: Secondary | ICD-10-CM | POA: Insufficient documentation

## 2015-02-10 DIAGNOSIS — S39012A Strain of muscle, fascia and tendon of lower back, initial encounter: Secondary | ICD-10-CM | POA: Insufficient documentation

## 2015-02-10 DIAGNOSIS — Y998 Other external cause status: Secondary | ICD-10-CM | POA: Insufficient documentation

## 2015-02-10 DIAGNOSIS — Z720 Tobacco use: Secondary | ICD-10-CM | POA: Insufficient documentation

## 2015-02-10 DIAGNOSIS — Y9389 Activity, other specified: Secondary | ICD-10-CM | POA: Insufficient documentation

## 2015-02-10 DIAGNOSIS — Z79899 Other long term (current) drug therapy: Secondary | ICD-10-CM | POA: Insufficient documentation

## 2015-02-10 DIAGNOSIS — Y9289 Other specified places as the place of occurrence of the external cause: Secondary | ICD-10-CM | POA: Insufficient documentation

## 2015-02-10 DIAGNOSIS — Z791 Long term (current) use of non-steroidal anti-inflammatories (NSAID): Secondary | ICD-10-CM | POA: Insufficient documentation

## 2015-02-10 MED ORDER — IBUPROFEN 600 MG PO TABS: 600.0000 mg | ORAL_TABLET | Freq: Four times a day (QID) | ORAL | Status: DC | PRN

## 2015-02-10 MED ORDER — KETOROLAC TROMETHAMINE 60 MG/2ML IM SOLN
60.0000 mg | Freq: Once | INTRAMUSCULAR | Status: AC
  Administered 2015-02-10: 60 mg via INTRAMUSCULAR
  Filled 2015-02-10: qty 2

## 2015-02-10 MED ORDER — CYCLOBENZAPRINE HCL 5 MG PO TABS: 5.0000 mg | ORAL_TABLET | Freq: Three times a day (TID) | ORAL | Status: AC | PRN

## 2015-02-10 MED ORDER — CYCLOBENZAPRINE HCL 10 MG PO TABS
10.0000 mg | ORAL_TABLET | Freq: Once | ORAL | Status: AC
  Administered 2015-02-10: 10 mg via ORAL
  Filled 2015-02-10: qty 1

## 2015-02-10 MED ORDER — HYDROCODONE-ACETAMINOPHEN 5-325 MG PO TABS
1.0000 | ORAL_TABLET | Freq: Once | ORAL | Status: AC
  Administered 2015-02-10: 1 via ORAL
  Filled 2015-02-10: qty 1

## 2015-02-10 MED ORDER — HYDROCODONE-ACETAMINOPHEN 5-325 MG PO TABS: 1.0000 | ORAL_TABLET | ORAL | Status: AC | PRN

## 2015-02-10 NOTE — ED Provider Notes (Signed)
CSN: [id number]     Arrival date & time 02/10/15  1849 History   First MD Initiated Contact with Patient 02/10/15 2048     Chief Complaint  Patient presents with  . Back Pain     (Consider location/radiation/quality/duration/timing/severity/associated sxs/prior Treatment) The history is provided by the patient.   Dakota Everett is a 34 y.o. male presenting with  acute on intermittent low back pain which has which has flared up since yesterday spent moving, lifting multiple heavy boxes.   Patient denies any new injury specifically, no falls, pain developed slowly during the course of yesterday.  There is no radiation of pain into the lower extremities.  There has been no weakness or numbness in the lower extremities and no urinary or bowel retention or incontinence.  Patient does not have a history of cancer or IVDU.  His symptoms are worsened with movement and with attempts to stand upright, resolves with rest. He has taken tylenol without relief.    History reviewed. No pertinent past medical history. History reviewed. No pertinent past surgical history. History reviewed. No pertinent family history. History  Substance Use Topics  . Smoking status: Current Every Day Smoker    Types: Cigarettes, Cigars  . Smokeless tobacco: Never Used  . Alcohol Use: Yes     Comment: rare    Review of Systems  Constitutional: Negative for fever.  Respiratory: Negative for shortness of breath.   Cardiovascular: Negative for chest pain and leg swelling.  Gastrointestinal: Negative for abdominal pain, constipation and abdominal distention.  Genitourinary: Negative for dysuria, urgency, frequency, flank pain and difficulty urinating.  Musculoskeletal: Positive for back pain. Negative for joint swelling and gait problem.  Skin: Negative for rash.  Neurological: Negative for weakness and numbness.      Allergies  Other and Tomato  Home Medications   Prior to Admission medications    Medication Sig Start Date End Date Taking? Authorizing Provider  amoxicillin (AMOXIL) 500 MG capsule Take 500 mg by mouth 3 (three) times daily.    Historical Provider, MD  cyclobenzaprine (FLEXERIL) 5 MG tablet Take 1 tablet (5 mg total) by mouth 3 (three) times daily as needed for muscle spasms. 02/10/15   Burgess AmorJulie Laresha Bacorn, PA-C  HYDROcodone-acetaminophen (NORCO/VICODIN) 5-325 MG per tablet Take 1 tablet by mouth every 4 (four) hours as needed. 02/10/15   Burgess AmorJulie Jaielle Dlouhy, PA-C  ibuprofen (ADVIL,MOTRIN) 600 MG tablet Take 1 tablet (600 mg total) by mouth every 6 (six) hours as needed. 02/10/15   Burgess AmorJulie Makayah Pauli, PA-C  loperamide (IMODIUM) 2 MG capsule Take two tabs po initially, then one tab after each loose stool: max 8 tabs in 24 hours 10/22/14   Hurman HornJohn M Bednar, MD  methocarbamol (ROBAXIN) 500 MG tablet Take 1 tablet (500 mg total) by mouth 3 (three) times daily. 11/26/14   Kathie DikeHobson M Bryant, PA-C  metoCLOPramide (REGLAN) 10 MG tablet Take 1 tablet (10 mg total) by mouth every 6 (six) hours as needed for nausea (nausea/headache). 10/22/14   Hurman HornJohn M Bednar, MD  Multiple Vitamin (MULTIVITAMIN WITH MINERALS) TABS tablet Take 1 tablet by mouth daily.    Historical Provider, MD  naproxen (NAPROSYN) 500 MG tablet Take 1 tablet (500 mg total) by mouth 2 (two) times daily. 08/12/14   Hope Orlene OchM Neese, NP  ondansetron (ZOFRAN ODT) 8 MG disintegrating tablet 8mg  ODT q4 hours prn nausea 10/22/14   Hurman HornJohn M Bednar, MD  ondansetron (ZOFRAN) 4 MG tablet Take 1 tablet (4 mg total) by mouth every  8 (eight) hours as needed. 12/27/14   Ward Givens, MD   BP 141/79 mmHg  Pulse 88  Temp(Src) 98 F (36.7 C) (Oral)  Resp 20  Ht  (1.803 m)  Wt 198 lb (89.812 kg)  BMI 27.63 kg/m2  SpO2 100% Physical Exam  Constitutional: He appears well-developed and well-nourished.  HENT:  Head: Normocephalic.  Eyes: Conjunctivae are normal.  Neck: Normal range of motion. Neck supple.  Cardiovascular: Normal rate and intact distal pulses.   Pedal  pulses normal.  Pulmonary/Chest: Effort normal.  Abdominal: Soft. Bowel sounds are normal. He exhibits no distension and no mass.  Musculoskeletal: Normal range of motion. He exhibits no edema.       Lumbar back: He exhibits tenderness and spasm. He exhibits no bony tenderness, no swelling and no edema.  Left paralumbar tenderness with spasm noted.  No midline tenderness.  Neurological: He is alert. He has normal strength. He displays no atrophy and no tremor. No sensory deficit. Gait normal.  Reflex Scores:      Patellar reflexes are 2+ on the right side and 2+ on the left side.      Achilles reflexes are 2+ on the right side and 2+ on the left side. No strength deficit noted in hip and knee flexor and extensor muscle groups.  Ankle flexion and extension intact.  Skin: Skin is warm and dry.  Psychiatric: He has a normal mood and affect.  Nursing note and vitals reviewed.   ED Course  Procedures (including critical care time) Labs Review Labs Reviewed - No data to display  Imaging Review No results found.   EKG Interpretation None      MDM   Final diagnoses:  Lumbar strain, initial encounter    No neuro deficit on exam or by history to suggest emergent or surgical presentation.  Also discussed worsened sx that should prompt immediate re-evaluation including distal weakness, bowel/bladder retention/incontinence.  Pt encouraged activity as tolerated.  Flexeril, hydrocodone, ibuprofen prescribed.  Prn f/u anticipated.        Burgess Amor, PA-C 02/11/15 1110  Vanetta Mulders, MD 02/12/15 [phone]

## 2015-02-10 NOTE — ED Notes (Signed)
Pt verbalized understanding of no driving and to use caution within 4 hours of taking pain meds due to meds cause drowsiness 

## 2015-02-10 NOTE — ED Notes (Signed)
Pt reports back pain after moving boxes yesterday. Onset of symptoms approx 1900 yesterday.

## 2015-02-10 NOTE — Discharge Instructions (Signed)
Lumbosacral Strain Lumbosacral strain is a strain of any of the parts that make up your lumbosacral vertebrae. Your lumbosacral vertebrae are the bones that make up the lower third of your backbone. Your lumbosacral vertebrae are held together by muscles and tough, fibrous tissue (ligaments).  CAUSES  A sudden blow to your back can cause lumbosacral strain. Also, anything that causes an excessive stretch of the muscles in the low back can cause this strain. This is typically seen when people exert themselves strenuously, fall, lift heavy objects, bend, or crouch repeatedly. RISK FACTORS  Physically demanding work.  Participation in pushing or pulling sports or sports that require a sudden twist of the back (tennis, golf, baseball).  Weight lifting.  Excessive lower back curvature.  Forward-tilted pelvis.  Weak back or abdominal muscles or both.  Tight hamstrings. SIGNS AND SYMPTOMS  Lumbosacral strain may cause pain in the area of your injury or pain that moves (radiates) down your leg.  DIAGNOSIS Your health care provider can often diagnose lumbosacral strain through a physical exam. In some cases, you may need tests such as X-ray exams.  TREATMENT  Treatment for your lower back injury depends on many factors that your clinician will have to evaluate. However, most treatment will include the use of anti-inflammatory medicines. HOME CARE INSTRUCTIONS   Avoid hard physical activities (tennis, racquetball, waterskiing) if you are not in proper physical condition for it. This may aggravate or create problems.  If you have a back problem, avoid sports requiring sudden body movements. Swimming and walking are generally safer activities.  Maintain good posture.  Maintain a healthy weight.  For acute conditions, you may put ice on the injured area.  Put ice in a plastic bag.  Place a towel between your skin and the bag.  Leave the ice on for 20 minutes, 2-3 times a day.  When the  low back starts healing, stretching and strengthening exercises may be recommended. SEEK MEDICAL CARE IF:  Your back pain is getting worse.  You experience severe back pain not relieved with medicines. SEEK IMMEDIATE MEDICAL CARE IF:   You have numbness, tingling, weakness, or problems with the use of your arms or legs.  There is a change in bowel or bladder control.  You have increasing pain in any area of the body, including your belly (abdomen).  You notice shortness of breath, dizziness, or feel faint.  You feel sick to your stomach (nauseous), are throwing up (vomiting), or become sweaty.  You notice discoloration of your toes or legs, or your feet get very cold. MAKE SURE YOU:   Understand these instructions.  Will watch your condition.  Will get help right away if you are not doing well or get worse. Document Released: 09/09/2005 Document Revised: 12/05/2013 Document Reviewed: 07/19/2013 [facility] Patient Information 2015 [place], [place]. This information is not intended to replace advice given to you by your health care provider. Make sure you discuss any questions you have with your health care provider.   Emergency Department Resource Guide 1) Find a Doctor and Pay Out of Pocket Although you won't have to find out who is covered by your insurance plan, it is a good idea to ask around and get recommendations. You will then need to call the office and see if the doctor you have chosen will accept you as a new patient and what types of options they offer for patients who are self-pay. Some doctors offer discounts or will set up payment plans for  their patients who do not have insurance, but you will need to ask so you aren't surprised when you get to your appointment.  2) Contact Your Local Health Department Not all health departments have doctors that can see patients for sick visits, but many do, so it is worth a call to see if yours does. If you don't know where your local  health department is, you can check in your phone book. The CDC also has a tool to help you locate your state's health department, and many state websites also have listings of all of their local health departments.  3) Find a [facility] If your illness is not likely to be very severe or complicated, you may want to try a walk in clinic. These are popping up all over the country in pharmacies, drugstores, and shopping centers. They're usually staffed by nurse practitioners or physician assistants that have been trained to treat common illnesses and complaints. They're usually fairly quick and inexpensive. However, if you have serious medical issues or chronic medical problems, these are probably not your best option.  No Primary Care Doctor: - Call Health Connect at  [phone] - they can help you locate a primary care doctor that  accepts your insurance, provides certain services, etc. - Physician Referral Service- [phone]  Chronic Pain Problems: Organization         Address  Phone   Notes  [facility]  [phone] Patients need to be referred by their primary care doctor.   Medication Assistance: Organization         Address  Phone   Notes  [facility] [address] [place] [place], [place] [address] [phone] --Must be a resident of [facility] -- Must have NO insurance coverage whatsoever (no Medicaid/ Medicare, etc.) -- The pt. MUST have a primary care doctor that directs their care regularly and follows them in the community   [facility]  [phone]   [employer]  [phone]    Agencies that provide inexpensive medical care: Organization         Address  Phone   Notes  [place]  [phone]   [facility]    [phone]   [facility] [place], [place] [address] [phone]   [place] [address] [place] [phone]   Planned Parenthood    [phone]   [facility]    [phone]   [place] and [place] [address], [place] Phone:  [phone], Fax:  [phone] Hours of Operation:  9 am - 6 pm, M-F.  Also accepts Medicaid/Medicare and self-pay.  [facility] [place], [address], [place] Phone: [phone], Fax: [phone]. Hours of Operation:  8:30 am - 5:30 pm, M-F.  Also accepts Medicaid and self-pay.  [facility] [place] [address], [place] Phone: [phone]   [place], [place], [place] [phone], Ext. [phone] Mondays & Thursdays: 7-9 AM.  First 15 patients are seen on a first come, first serve basis.    [place] Providers:  Organization         Address  Phone   Notes  [facility] [address], [place] [phone] Also accepts self-pay patients.  [facility] [address], [place]  [phone]   Reserve, [address], [place] [phone]   [facility] [address], [place] [phone]   Lucianne Lei [address], [place]   [phone] Only accepts [place] Access [place] patients after they have their name applied to their card.   Self-Pay (no insurance) in [facility]:  Organization         Address  Phone   Notes  Sickle Cell Patients, [facility] [place] [phone]   [facility] [place] [phone]   [facility] [place]  [place], [place], [place] [phone]   [facility]/Dr. Osei-Bonsu  [address], [place] or [address], [place] [phone] Phone number for both [place] and  [place] locations is the same.  [facility] [address], [place] [phone]   [facility] [address] [place] or [address] [phone]   [facility] [address] [place] [phone], phone; [phone], fax Sees patients 1st and 3rd Saturday of every month.  Must not qualify for public or private insurance (i.e. Medicaid, Medicare, [place] Health Choice, Veterans' Benefits)  Household income should be no more than 200% of the poverty level The clinic cannot treat you if you are pregnant or think you are pregnant  Sexually transmitted diseases are not treated at the clinic.    Dental Care: Organization         Address  Phone  Notes  [facility] Department of [facility] [place] [phone] Accepts children up to age 47 who are enrolled in [place] or [place]; pregnant women with a Medicaid card; and children who have applied for Medicaid or [place] Health Choice, but were declined, whose parents can pay a reduced fee at time of service.  [facility] Department of [facility]  [address], [place] [phone] Accepts children up to age 43 who are enrolled in [place] or [place]; pregnant women with a Medicaid card; and children who have applied for Medicaid or [place] Health Choice, but were declined, whose parents can pay a reduced fee at time of service.  [place] Adult Dental Access PROGRAM  [place] [phone] Patients are seen by appointment only. Walk-ins are not accepted. [place] will see patients 80 years of age and older. Monday - Tuesday (8am-5pm) Most Wednesdays (8:30-5pm) $30 per visit, cash only  [facility] Adult Dental Access PROGRAM  [address], [facility] [phone] Patients are seen by appointment only. Walk-ins are not accepted.  [place] will see patients 33 years of age and older. One Wednesday Evening (Monthly: Volunteer Based).  $30 per visit, cash only  [place]  [phone] for adults; Children under age 66, call Graduate Pediatric Dentistry at [phone]. Children aged 60-14, please call [phone] to request a pediatric application.  Dental services are provided in all areas of dental care including fillings, crowns and bridges, complete and partial dentures, implants, gum treatment, root canals, and extractions. Preventive care is also provided. Treatment is provided to both adults  and children. Patients are selected via a lottery and there is often a waiting list.   [facility] [address], [place]  [phone] [email or web address]   [facility] [address] [place], [place] [phone], Ext. [phone] Second and Fourth Thursday of each month, opens at 6:30 AM; Clinic ends at 9 AM.  Patients are seen on a first-come first-served basis, and a limited number are seen during each clinic.   [facility]  [address] [place], [place] [phone]   Eligibility Requirements You must have lived in [place], [place], or [place] counties for at least the last three months.   You cannot be eligible for state or federal sponsored [employer], including [employer], [place], or [employer].   You generally cannot be eligible for healthcare insurance through your employer.    How to apply: Eligibility screenings are held every Tuesday and Wednesday afternoon from 1:00 pm until 4:00 pm. You do not need an appointment for the interview!  [facility] [address] [place], [place]   [place]  [place] Department  [place]  [phone]    Behavioral Health Resources in the  Community: Intensive Outpatient Programs Organization         Address  Phone  Notes  [place]. [address], [place], [place] [phone]   [facility] [address], [place], [place] and [place]   ADS: Alcohol & Drug Svcs [address], [place], [place]   [place] [address],  [place], [place] or [phone]   Substance Abuse Resources Organization         Address  Phone  Notes  [facility] Drug Services  [phone]   [place]  [phone]   [facility] [place]   Chinita Pester  [phone]   Residential & Outpatient Substance Abuse Program  [phone]   Psychological Services Organization         Address  Phone  Notes  [facility] [place]  [place]  [phone]   [place] [address], [place] or [phone]    Mobile Crisis Teams Organization         Address  Phone  Notes  [facility], Mobile Crisis Care Unit  [phone]   [facility]  [address] [place], [place]   [place] Levels [address], [place] [phone]    Self-Help/Support Groups Organization         Address  Phone             Notes  [place]. of [place] - variety of support groups  [place] Call for more information  Narcotics Anonymous (NA), [facility] [address], [employer] [place]  2 meetings at this location   Special educational needs teacher         Address  Phone  Notes  [facility] [place],    [place]  [phone]   [facility]  [address], [place] 354562, [place], [place]   [place], [place] [phone] Admissions: 8am-3pm M-F  Incentives Substance [place] [address]    [place], [place] [phone]   [facility]  [facility] [address], [place], [place] [phone]   [facility] [address]  [place], [place] [phone]   [facility] - Intensive Outpatient [address], [place], [place] [phone]   [facility] ([facility] [address]  [place], [place] [phone] or [phone]   [facility] (RTS) [address] [place], [place] [phone] Accepts Medicaid  [facility] [address]  [place] [phone] Substance Abuse/Addiction Treatment   [facility] Behavioral Health Resources Organization         Address  Phone  Notes  [facility]  [phone]) [phone]   Angie FavaJulie Brannon, PhD [address], [address] [place], [place]   [phone] or [phone]   [facility]   [address] [place], [place] [phone]   [facility] [phone], [place], [place] [phone] Insurance/Medicaid/sponsorship through [facility]  [facility] Families [address]                                    [place], [place] [phone] Therapy/tele-psych/case  [facility] [address]   [place], [place] [phone]    Dr. Lolly MustacheArfeen  [phone]   [facility] [place]  [employer] [facility] 1) [address], [place] 2) [address], [place] 3)  [address], [place] [phone]  [phone]   [facility] Child Abuse Hotline [phone] or [phone](336) 5796472252 (After Hours)          Use the medicines as directed.  Do not drive within 4 hours of taking hydrocodone as this will make you drowsy.  Avoid lifting,  Bending,  Twisting or any other activity that worsens your pain over the next week.  Apply an  icepack  to your lower back for 10-15 minutes every 2 hours for the next 2 days.  You should get rechecked if your symptoms are not better over the next  5 days,  Or you develop increased pain,  Weakness in your leg(s) or loss of bladder or bowel function - these are symptoms of a worse injury.

## 2015-02-14 ENCOUNTER — Emergency Department (HOSPITAL_COMMUNITY)
Admission: EM | Admit: 2015-02-14 | Discharge: 2015-02-14 | Disposition: A | Discharge status: Home / Self Care | Payer: Self-pay | Attending: Emergency Medicine | Admitting: Emergency Medicine

## 2015-02-14 ENCOUNTER — Encounter (HOSPITAL_COMMUNITY): Payer: Self-pay | Admitting: Emergency Medicine

## 2015-02-14 DIAGNOSIS — M545 Low back pain: Secondary | ICD-10-CM | POA: Insufficient documentation

## 2015-02-14 DIAGNOSIS — Z792 Long term (current) use of antibiotics: Secondary | ICD-10-CM | POA: Insufficient documentation

## 2015-02-14 DIAGNOSIS — Z720 Tobacco use: Secondary | ICD-10-CM | POA: Insufficient documentation

## 2015-02-14 DIAGNOSIS — Z79899 Other long term (current) drug therapy: Secondary | ICD-10-CM | POA: Insufficient documentation

## 2015-02-14 NOTE — ED Notes (Signed)
Pt c/o continued back pain. Pt seen in ED Sunday and dx with muscle strain. States hurting worse since using jack hammer at work yesterday.

## 2015-02-14 NOTE — ED Provider Notes (Signed)
CSN: [id number]     Arrival date & time 02/14/15  0732 History   First MD Initiated Contact with Patient 02/14/15 0805     Chief Complaint  Patient presents with  . Back Pain     (Consider location/radiation/quality/duration/timing/severity/associated sxs/prior Treatment) Patient is a 34 y.o. male presenting with back pain. The history is provided by the patient.  Back Pain Location:  Lumbar spine Quality:  Aching and shooting Pain severity:  Moderate Pain is:  Same all the time Onset quality:  Gradual Duration:  4 days Timing:  Intermittent Progression:  Worsening Chronicity:  New Context comment:  Pt went to work sooner than suggested and worked with a Harriett SineJack Hammer.  Relieved by:  Nothing Worsened by:  Movement and bending Ineffective treatments:  None tried Associated symptoms: no abdominal pain, no bladder incontinence, no bowel incontinence, no chest pain, no dysuria and no perianal numbness   Risk factors: no recent surgery     History reviewed. No pertinent past medical history. History reviewed. No pertinent past surgical history. No family history on file. History  Substance Use Topics  . Smoking status: Current Every Day Smoker    Types: Cigarettes, Cigars  . Smokeless tobacco: Never Used  . Alcohol Use: Yes     Comment: rare    Review of Systems  Constitutional: Negative for activity change.       All ROS Neg except as noted in HPI  HENT: Negative for nosebleeds.   Eyes: Negative for photophobia and discharge.  Respiratory: Negative for cough, shortness of breath and wheezing.   Cardiovascular: Negative for chest pain and palpitations.  Gastrointestinal: Negative for abdominal pain, blood in stool and bowel incontinence.  Genitourinary: Negative for bladder incontinence, dysuria, frequency and hematuria.  Musculoskeletal: Positive for back pain. Negative for arthralgias and neck pain.  Skin: Negative.   Neurological: Negative for dizziness, seizures and  speech difficulty.  Psychiatric/Behavioral: Negative for hallucinations and confusion.      Allergies  Other and [place]ato  Home Medications   Prior to Admission medications   Medication Sig Start Date End Date Taking? Authorizing Provider  amoxicillin (AMOXIL) 500 MG capsule Take 500 mg by mouth 3 (three) times daily.    Historical Provider, MD  cyclobenzaprine (FLEXERIL) 5 MG tablet Take 1 tablet (5 mg total) by mouth 3 (three) times daily as needed for muscle spasms. 02/10/15   Burgess AmorJulie Idol, PA-C  HYDROcodone-acetaminophen (NORCO/VICODIN) 5-325 MG per tablet Take 1 tablet by mouth every 4 (four) hours as needed. 02/10/15   Burgess AmorJulie Idol, PA-C  ibuprofen (ADVIL,MOTRIN) 600 MG tablet Take 1 tablet (600 mg total) by mouth every 6 (six) hours as needed. 02/10/15   Burgess AmorJulie Idol, PA-C  loperamide (IMODIUM) 2 MG capsule Take two tabs po initially, then one tab after each loose stool: max 8 tabs in 24 hours 10/22/14   Hurman HornJohn M Bednar, MD  methocarbamol (ROBAXIN) 500 MG tablet Take 1 tablet (500 mg total) by mouth 3 (three) times daily. 11/26/14   Kathie DikeHobson M Sakshi Sermons, PA-C  metoCLOPramide (REGLAN) 10 MG tablet Take 1 tablet (10 mg total) by mouth every 6 (six) hours as needed for nausea (nausea/headache). 10/22/14   Hurman HornJohn M Bednar, MD  Multiple Vitamin (MULTIVITAMIN WITH MINERALS) TABS tablet Take 1 tablet by mouth daily.    Historical Provider, MD  naproxen (NAPROSYN) 500 MG tablet Take 1 tablet (500 mg total) by mouth 2 (two) times daily. 08/12/14   Hope Orlene OchM Neese, NP  ondansetron (ZOFRAN ODT) 8  MG disintegrating tablet  ODT q4 hours prn nausea 10/22/14   Hurman Horn, MD  ondansetron (ZOFRAN) 4 MG tablet Take 1 tablet (4 mg total) by mouth every 8 (eight) hours as needed. 12/27/14   Ward Givens, MD   BP 148/82 mmHg  Pulse 81  Temp(Src) 98.6 F (37 C)  Resp 16  Ht  (1.803 m)  Wt 198 lb (89.812 kg)  BMI 27.63 kg/m2  SpO2 100% Physical Exam  Constitutional: He is oriented to person, place, and time. He  appears well-developed and well-nourished.  Non-toxic appearance.  HENT:  Head: Normocephalic.  Right Ear: Tympanic membrane and external ear normal.  Left Ear: Tympanic membrane and external ear normal.  Eyes: EOM and lids are normal. Pupils are equal, round, and reactive to light.  Neck: Normal range of motion. Neck supple. Carotid bruit is not present.  Cardiovascular: Normal rate, regular rhythm, normal heart sounds, intact distal pulses and normal pulses.   Pulmonary/Chest: Breath sounds normal. No respiratory distress.  Abdominal: Soft. Bowel sounds are normal. There is no tenderness. There is no guarding.  Musculoskeletal: He exhibits tenderness.       Lumbar back: He exhibits decreased range of motion, tenderness, pain and spasm. He exhibits no deformity.  Lymphadenopathy:       Head (right side): No submandibular adenopathy present.       Head (left side): No submandibular adenopathy present.    He has no cervical adenopathy.  Neurological: He is alert and oriented to person, place, and time. He has normal strength. No cranial nerve deficit or sensory deficit.  Gait steady. No foot drop  Skin: Skin is warm and dry.  Psychiatric: He has a normal mood and affect. His speech is normal.  Nursing note and vitals reviewed.   ED Course  Procedures (including critical care time) Labs Review Labs Reviewed - No data to display  Imaging Review No results found.   EKG Interpretation None      MDM  No gross neuro deficit noted. 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  Work note given for pt to return on Monday March 7. Pt to return if any changes or problem.   Final diagnoses:  None    *I have reviewed nursing notes, vital signs, and all appropriate lab and imaging results for this patient.[address] PA-C 02/16/15 0145  Donnetta Hutching, MD 02/23/15 0830

## 2015-02-14 NOTE — Discharge Instructions (Signed)
Back Pain, Adult PLEASE REST YOUR BACK AND USE MEDICATIONS AS SUGGESTED. PLEASE SE DR MURPHY FOR EVALUATION IF NOT IMPROVING.                                                                                               Back pain is very common. The pain often gets better over time. The cause of back pain is usually not dangerous. Most people can learn to manage their back pain on their own.  HOME CARE   Stay active. Start with short walks on flat ground if you can. Try to walk farther each day.  Do not sit, drive, or stand in one place for more than 30 minutes. Do not stay in bed.  Do not avoid exercise or work. Activity can help your back heal faster.  Be careful when you bend or lift an object. Bend at your knees, keep the object close to you, and do not twist.  Sleep on a firm mattress. Lie on your side, and bend your knees. If you lie on your back, put a pillow under your knees.  Only take medicines as told by your doctor.  Put ice on the injured area.  Put ice in a plastic bag.  Place a towel between your skin and the bag.  Leave the ice on for 15-20 minutes, 03-04 times a day for the first 2 to 3 days. After that, you can switch between ice and heat packs.  Ask your doctor about back exercises or massage.  Avoid feeling anxious or stressed. Find good ways to deal with stress, such as exercise. GET HELP RIGHT AWAY IF:   Your pain does not go away with rest or medicine.  Your pain does not go away in 1 week.  You have new problems.  You do not feel well.  The pain spreads into your legs.  You cannot control when you poop (bowel movement) or pee (urinate).  Your arms or legs feel weak or lose feeling (numbness).  You feel sick to your stomach (nauseous) or throw up (vomit).  You have belly (abdominal) pain.  You feel like you may pass out (faint). MAKE SURE YOU:   Understand these instructions.  Will watch your condition.  Will get help right away if you are  not doing well or get worse. Document Released: 05/18/2008 Document Revised: 02/22/2012 Document Reviewed: 04/03/2014 [facility] Patient Information 2015 [place], [place]. This information is not intended to replace advice given to you by your health care provider. Make sure you discuss any questions you have with your health care provider.

## 2015-08-11 ENCOUNTER — Emergency Department (HOSPITAL_COMMUNITY)
Admission: EM | Admit: 2015-08-11 | Discharge: 2015-08-11 | Disposition: A | Discharge status: Home / Self Care | Payer: Self-pay | Attending: Physician Assistant | Admitting: Physician Assistant

## 2015-08-11 ENCOUNTER — Encounter (HOSPITAL_COMMUNITY): Payer: Self-pay | Admitting: *Deleted

## 2015-08-11 ENCOUNTER — Emergency Department (HOSPITAL_COMMUNITY): Payer: Self-pay

## 2015-08-11 DIAGNOSIS — X58XXXA Exposure to other specified factors, initial encounter: Secondary | ICD-10-CM | POA: Insufficient documentation

## 2015-08-11 DIAGNOSIS — Y9389 Activity, other specified: Secondary | ICD-10-CM | POA: Insufficient documentation

## 2015-08-11 DIAGNOSIS — Z791 Long term (current) use of non-steroidal anti-inflammatories (NSAID): Secondary | ICD-10-CM | POA: Insufficient documentation

## 2015-08-11 DIAGNOSIS — Z79899 Other long term (current) drug therapy: Secondary | ICD-10-CM | POA: Insufficient documentation

## 2015-08-11 DIAGNOSIS — Y998 Other external cause status: Secondary | ICD-10-CM | POA: Insufficient documentation

## 2015-08-11 DIAGNOSIS — Z792 Long term (current) use of antibiotics: Secondary | ICD-10-CM | POA: Insufficient documentation

## 2015-08-11 DIAGNOSIS — S6391XA Sprain of unspecified part of right wrist and hand, initial encounter: Secondary | ICD-10-CM | POA: Insufficient documentation

## 2015-08-11 DIAGNOSIS — Y9289 Other specified places as the place of occurrence of the external cause: Secondary | ICD-10-CM | POA: Insufficient documentation

## 2015-08-11 DIAGNOSIS — Z720 Tobacco use: Secondary | ICD-10-CM | POA: Insufficient documentation

## 2015-08-11 MED ORDER — IBUPROFEN 800 MG PO TABS: 800.0000 mg | ORAL_TABLET | Freq: Three times a day (TID) | ORAL | Status: AC

## 2015-08-11 MED ORDER — IBUPROFEN 800 MG PO TABS
800.0000 mg | ORAL_TABLET | Freq: Once | ORAL | Status: AC
  Administered 2015-08-11: 800 mg via ORAL
  Filled 2015-08-11: qty 1
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
## 2015-08-11 NOTE — Discharge Instructions (Signed)
Ligament Sprain °A ligament sprain is when the bands of tissue that hold bones together (ligament) are stretched. °HOME CARE  °· Rest the injured area. °· Start using the joint when told to by your doctor. °· Keep the injured area raised (elevated) above the level of the heart. This may lessen puffiness (swelling). °· Put ice on the injured area. °¨ Put ice in a plastic bag. °¨ Place a towel between your skin and the bag. °¨ Leave the ice on for 15-20 minutes, 03-04 times a day. °· Wear a splint, cast, or an elastic bandage as told by your doctor. °· Only take medicine as told by your doctor. °· Use crutches as told by your doctor. Do not put weight on the injured joint until told to by your doctor. °GET HELP RIGHT AWAY IF:  °· You have more bruising, puffiness, or pain. °· The leg was injured and the toes are cold, tingling, numb, or blue. °· The arm was injured and the fingers are cold, tingling, numb, or blue. °· The pain is not helped with medicine. °· The pain gets worse. °MAKE SURE YOU:  °· Understand these instructions. °· Will watch this condition. °· Will get help right away if you are not doing well or get worse. °Document Released: 05/18/2008 Document Revised: 09/20/2013 Document Reviewed: 05/18/2008 °ExitCare® Patient Information ©2015 [employer]. This information is not intended to replace advice given to you by your health care provider. Make sure you discuss any questions you have with your health care provider. ° °
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
## 2018-07-30 ENCOUNTER — Encounter (HOSPITAL_COMMUNITY): Payer: Self-pay

## 2018-07-30 ENCOUNTER — Emergency Department (HOSPITAL_COMMUNITY)
Admission: EM | Admit: 2018-07-30 | Discharge: 2018-07-30 | Disposition: A | Discharge status: Home / Self Care | Payer: BLUE CROSS/BLUE SHIELD | Attending: Emergency Medicine | Admitting: Emergency Medicine

## 2018-07-30 DIAGNOSIS — Z711 Person with feared health complaint in whom no diagnosis is made: Secondary | ICD-10-CM | POA: Insufficient documentation

## 2018-07-30 DIAGNOSIS — Z113 Encounter for screening for infections with a predominantly sexual mode of transmission: Secondary | ICD-10-CM | POA: Insufficient documentation

## 2018-07-30 DIAGNOSIS — Z202 Contact with and (suspected) exposure to infections with a predominantly sexual mode of transmission: Secondary | ICD-10-CM | POA: Diagnosis not present

## 2018-07-30 DIAGNOSIS — Z79899 Other long term (current) drug therapy: Secondary | ICD-10-CM | POA: Insufficient documentation

## 2018-07-30 DIAGNOSIS — F17210 Nicotine dependence, cigarettes, uncomplicated: Secondary | ICD-10-CM | POA: Insufficient documentation

## 2018-07-30 LAB — URINALYSIS, ROUTINE W REFLEX MICROSCOPIC
BILIRUBIN URINE: NEGATIVE
Glucose, UA: NEGATIVE mg/dL
Hgb urine dipstick: NEGATIVE
KETONES UR: NEGATIVE mg/dL
NITRITE: NEGATIVE
PH: 7 (ref 5.0–8.0)
PROTEIN: NEGATIVE mg/dL
Specific Gravity, Urine: 1.028 (ref 1.005–1.030)

## 2018-07-30 MED ORDER — STERILE WATER FOR INJECTION IJ SOLN
INTRAMUSCULAR | Status: AC
  Administered 2018-07-30: 1 mL
  Filled 2018-07-30: qty 10

## 2018-07-30 MED ORDER — CEFTRIAXONE SODIUM 250 MG IJ SOLR
250.0000 mg | Freq: Once | INTRAMUSCULAR | Status: AC
  Administered 2018-07-30: 250 mg via INTRAMUSCULAR
  Filled 2018-07-30: qty 250

## 2018-07-30 MED ORDER — METRONIDAZOLE 500 MG PO TABS
2000.0000 mg | ORAL_TABLET | Freq: Once | ORAL | Status: AC
  Administered 2018-07-30: 2000 mg via ORAL
  Filled 2018-07-30: qty 4

## 2018-07-30 MED ORDER — AZITHROMYCIN 250 MG PO TABS
1000.0000 mg | ORAL_TABLET | Freq: Once | ORAL | Status: AC
  Administered 2018-07-30: 1000 mg via ORAL
  Filled 2018-07-30: qty 4

## 2018-07-30 NOTE — ED Notes (Signed)
ED Provider at bedside. 

## 2018-07-30 NOTE — Discharge Instructions (Addendum)
You were treated for possible gonorrhea, chlamydia and trichomonas today.  Follow-up with the health department.  Use condoms for every sexual encounter.  Return to the ED if you develop new or worsening symptoms.

## 2018-07-30 NOTE — ED Provider Notes (Signed)
[facility]   CSN: [id number] Arrival date & time: 07/30/18  0023     History   Chief Complaint Chief Complaint  Patient presents with  . S74.5    HPI Dakota Everett is a 37 y.o. male.  Patient states he was told by his girlfriend that he should be treated for trichomonas.  His girlfriend was recently seen for UTI and apparently tested positive for trichomonas.  He last had sex with her about 3 weeks ago.  Patient denies any symptoms.  No dysuria, hematuria, discharge.  No testicular pain, vomiting, fever, chest pain or shortness of breath.  He is not sexually active with anyone else.  He does not use condoms.  He does not think she is sexually active with anyone else.  The history is provided by the patient.    No past medical history on file.  Patient Active Problem List   Diagnosis Date Noted  . Contusion of arm 11/01/2013    History reviewed. No pertinent surgical history.      Home Medications    Prior to Admission medications   Medication Sig Start Date End Date Taking? Authorizing Provider  amoxicillin (AMOXIL) 500 MG capsule Take 500 mg by mouth 3 (three) times daily.    [provider]  cyclobenzaprine (FLEXERIL) 5 MG tablet Take 1 tablet (5 mg total) by mouth 3 (three) times daily as needed for muscle spasms. 02/10/15   Burgess AmorIdol, Julie, PA-C  HYDROcodone-acetaminophen (NORCO/VICODIN) 5-325 MG per tablet Take 1 tablet by mouth every 4 (four) hours as needed. 02/10/15   Burgess AmorIdol, Julie, PA-C  ibuprofen (ADVIL,MOTRIN) 800 MG tablet Take 1 tablet (800 mg total) by mouth 3 (three) times daily. 08/11/15   Triplett, Tammy, PA-C  loperamide (IMODIUM) 2 MG capsule Take two tabs po initially, then one tab after each loose stool: max 8 tabs in 24 hours 10/22/14   Wayland SalinasBednar, John, MD  methocarbamol (ROBAXIN) 500 MG tablet Take 1 tablet (500 mg total) by mouth 3 (three) times daily. 11/26/14   Ivery QualeBryant, Hobson, PA-C  metoCLOPramide (REGLAN) 10  MG tablet Take 1 tablet (10 mg total) by mouth every 6 (six) hours as needed for nausea (nausea/headache). 10/22/14   Wayland SalinasBednar, John, MD  Multiple Vitamin (MULTIVITAMIN WITH MINERALS) TABS tablet Take 1 tablet by mouth daily.    [provider]  naproxen (NAPROSYN) 500 MG tablet Take 1 tablet (500 mg total) by mouth 2 (two) times daily. 08/12/14   Janne NapoleonNeese, Hope M, NP  ondansetron (ZOFRAN ODT) 8 MG disintegrating tablet 8mg  ODT q4 hours prn nausea 10/22/14   Wayland SalinasBednar, John, MD  ondansetron (ZOFRAN) 4 MG tablet Take 1 tablet (4 mg total) by mouth every 8 (eight) hours as needed. 12/27/14   Devoria AlbeKnapp, Iva, MD    Family History No family history on file.  Social History Social History   Tobacco Use  . Smoking status: Current Every Day Smoker    Packs/day: 0.33    Types: Cigarettes, Cigars  . Smokeless tobacco: Never Used  Substance Use Topics  . Alcohol use: Yes    Comment: rare  . Drug use: No     Allergies   Other and Tomato   Review of Systems Review of Systems  Constitutional: Negative for appetite change and fever.  Eyes: Negative for visual disturbance.  Respiratory: Negative for cough, chest tightness and shortness of breath.   Cardiovascular: Negative for chest pain.  Gastrointestinal: Negative for abdominal pain, nausea and vomiting.  Genitourinary: Negative for discharge, dysuria, testicular pain and urgency.  Musculoskeletal: Negative for arthralgias and back pain.  Skin: Negative for rash.  Neurological: Negative for dizziness, weakness and numbness.    all other systems are negative except as noted in the HPI and PMH.    Physical Exam Updated Vital Signs BP 129/78 (BP Location: Left Arm)   Pulse 88   Temp 99 F (37.2 C) (Oral)   Resp 20   Ht 5\' 11"  (1.803 m)   Wt 95.3 kg   SpO2 98%   BMI 29.29 kg/m   Physical Exam  Constitutional: He is oriented to person, place, and time. He appears well-developed and well-nourished. No distress.  HENT:  Head:  Normocephalic and atraumatic.  Mouth/Throat: Oropharynx is clear and moist. No oropharyngeal exudate.  Eyes: Pupils are equal, round, and reactive to light. Conjunctivae and EOM are normal.  Neck: Normal range of motion. Neck supple.  No meningismus.  Cardiovascular: Normal rate, regular rhythm, normal heart sounds and intact distal pulses.  No murmur heard. Pulmonary/Chest: Effort normal and breath sounds normal. No respiratory distress.  Abdominal: Soft. There is no tenderness. There is no rebound and no guarding.  Genitourinary:  Genitourinary Comments: GU exam normal, no rashes, no discharge, no testicular tenderness  Musculoskeletal: Normal range of motion. He exhibits no edema or tenderness.  Neurological: He is alert and oriented to person, place, and time. No cranial nerve deficit. He exhibits normal muscle tone. Coordination normal.  No ataxia on finger to nose bilaterally. No pronator drift. 5/5 strength throughout. CN 2-12 intact.Equal grip strength. Sensation intact.   Skin: Skin is warm.  Psychiatric: He has a normal mood and affect. His behavior is normal.  Nursing note and vitals reviewed.    ED Treatments / Results  Labs (all labs ordered are listed, but only abnormal results are displayed) Labs Reviewed  URINALYSIS, ROUTINE W REFLEX MICROSCOPIC - Abnormal; Notable for the following components:      Result Value   Leukocytes, UA TRACE (*)    Bacteria, UA RARE (*)    All other components within normal limits  URINE CULTURE    EKG None  Radiology No results found.  Procedures Procedures (including critical care time)  Medications Ordered in ED Medications  metroNIDAZOLE (FLAGYL) tablet 2,000 mg (has no administration in time range)  cefTRIAXone (ROCEPHIN) injection 250 mg (has no administration in time range)  azithromycin (ZITHROMAX) tablet 1,000 mg (has no administration in time range)     Initial Impression / Assessment and Plan / ED Course  I have  reviewed the triage vital signs and the nursing notes.  Pertinent labs & imaging results that were available during my care of the patient were reviewed by me and considered in my medical decision making (see chart for details).    Patient requesting treatment for trichomonas exposure.  Denies symptoms.  Discussed with patient.  We will treat for trichomonas as well as gonorrhea and chlamydia.  He declines testing.  Safe sex practices discussed.  Follow-up with health department. Return precautions discussed.    Final Clinical Impressions(s) / ED Diagnoses   Final diagnoses:  STD exposure    ED Discharge Orders    None       Zeki Bedrosian, Jeannett SeniorStephen, MD 07/30/18 [phone]

## 2018-07-30 NOTE — ED Triage Notes (Signed)
Pt states his gf was diagnosed with trichomonas yesterday, and he wants to be checked.  Pt denies complaints.

## 2018-07-31 LAB — URINE CULTURE: Culture: NO GROWTH

## 2020-01-13 ENCOUNTER — Encounter (HOSPITAL_COMMUNITY): Payer: Self-pay | Admitting: Emergency Medicine

## 2020-01-13 ENCOUNTER — Emergency Department (HOSPITAL_COMMUNITY)
Admission: EM | Admit: 2020-01-13 | Discharge: 2020-01-13 | Disposition: A | Discharge status: Home / Self Care | Payer: BLUE CROSS/BLUE SHIELD | Attending: Emergency Medicine | Admitting: Emergency Medicine

## 2020-01-13 ENCOUNTER — Other Ambulatory Visit: Payer: Self-pay

## 2020-01-13 DIAGNOSIS — Y929 Unspecified place or not applicable: Secondary | ICD-10-CM | POA: Insufficient documentation

## 2020-01-13 DIAGNOSIS — S51812A Laceration without foreign body of left forearm, initial encounter: Secondary | ICD-10-CM

## 2020-01-13 DIAGNOSIS — Z23 Encounter for immunization: Secondary | ICD-10-CM | POA: Insufficient documentation

## 2020-01-13 DIAGNOSIS — Z79899 Other long term (current) drug therapy: Secondary | ICD-10-CM | POA: Insufficient documentation

## 2020-01-13 DIAGNOSIS — W260XXA Contact with knife, initial encounter: Secondary | ICD-10-CM | POA: Insufficient documentation

## 2020-01-13 DIAGNOSIS — F17210 Nicotine dependence, cigarettes, uncomplicated: Secondary | ICD-10-CM | POA: Insufficient documentation

## 2020-01-13 DIAGNOSIS — Y999 Unspecified external cause status: Secondary | ICD-10-CM | POA: Insufficient documentation

## 2020-01-13 DIAGNOSIS — Y939 Activity, unspecified: Secondary | ICD-10-CM | POA: Insufficient documentation

## 2020-01-13 MED ORDER — LIDOCAINE HCL (PF) 1 % IJ SOLN
5.0000 mL | Freq: Once | INTRAMUSCULAR | Status: AC
  Administered 2020-01-13: 5 mL via INTRADERMAL
  Filled 2020-01-13: qty 6

## 2020-01-13 MED ORDER — TETANUS-DIPHTH-ACELL PERTUSSIS 5-2.5-18.5 LF-MCG/0.5 IM SUSP
0.5000 mL | Freq: Once | INTRAMUSCULAR | Status: AC
  Administered 2020-01-13: 0.5 mL via INTRAMUSCULAR
  Filled 2020-01-13: qty 0.5

## 2020-01-13 NOTE — ED Triage Notes (Signed)
Injury to left wrist fixing dog cage.  Rates pain 0/10.  No bleeding noted.

## 2020-01-13 NOTE — ED Provider Notes (Signed)
[facility] EMERGENCY DEPARTMENT Provider Note   CSN: [id number] Arrival date & time: 01/13/20  1411     History Chief Complaint  Patient presents with  . Wrist Injury    left    Dakota Everett is a 39 y.o. male  The history is provided by the patient. No language interpreter was used.  Laceration Location:  Shoulder/arm Shoulder/arm laceration location:  L forearm Length:  1.5 cm Depth:  Cutaneous Bleeding: controlled   Time since incident:  20 minutes Laceration mechanism:  Knife Pain details:    Quality:  Aching   Severity:  Mild   Timing:  Intermittent Foreign body present:  No foreign bodies Tetanus status:  Unknown Associated symptoms: no fever, no focal weakness, no numbness, no rash, no redness, no swelling and no streaking        History reviewed. No pertinent past medical history.  Patient Active Problem List   Diagnosis Date Noted  . Contusion of arm 11/01/2013    History reviewed. No pertinent surgical history.     History reviewed. No pertinent family history.  Social History   Tobacco Use  . Smoking status: Current Every Day Smoker    Packs/day: 0.33    Types: Cigarettes, Cigars  . Smokeless tobacco: Never Used  Substance Use Topics  . Alcohol use: Yes    Comment: rare  . Drug use: No    Home Medications Prior to Admission medications   Medication Sig Start Date End Date Taking? Authorizing Provider  amoxicillin (AMOXIL) 500 MG capsule Take 500 mg by mouth 3 (three) times daily.    [provider]  cyclobenzaprine (FLEXERIL) 5 MG tablet Take 1 tablet (5 mg total) by mouth 3 (three) times daily as needed for muscle spasms. 02/10/15   Burgess Amor, PA-C  HYDROcodone-acetaminophen (NORCO/VICODIN) 5-325 MG per tablet Take 1 tablet by mouth every 4 (four) hours as needed. 02/10/15   Burgess Amor, PA-C  ibuprofen (ADVIL,MOTRIN) 800 MG tablet Take 1 tablet (800 mg total) by mouth 3 (three) times daily. 08/11/15   Triplett, Tammy, PA-C    loperamide (IMODIUM) 2 MG capsule Take two tabs po initially, then one tab after each loose stool: max 8 tabs in 24 hours 10/22/14   Wayland Salinas, MD  methocarbamol (ROBAXIN) 500 MG tablet Take 1 tablet (500 mg total) by mouth 3 (three) times daily. 11/26/14   Ivery Quale, PA-C  metoCLOPramide (REGLAN) 10 MG tablet Take 1 tablet (10 mg total) by mouth every 6 (six) hours as needed for nausea (nausea/headache). 10/22/14   Wayland Salinas, MD  Multiple Vitamin (MULTIVITAMIN WITH MINERALS) TABS tablet Take 1 tablet by mouth daily.    [provider]  naproxen (NAPROSYN) 500 MG tablet Take 1 tablet (500 mg total) by mouth 2 (two) times daily. 08/12/14   Janne Napoleon, NP  ondansetron (ZOFRAN ODT) 8 MG disintegrating tablet 8mg  ODT q4 hours prn nausea 10/22/14   13/9/15, MD  ondansetron (ZOFRAN) 4 MG tablet Take 1 tablet (4 mg total) by mouth every 8 (eight) hours as needed. 12/27/14   12/29/14, MD    Allergies    Other and Tomato  Review of Systems   Review of Systems  Constitutional: Negative for chills and fever.  Skin: Positive for wound. Negative for rash.  Neurological: Negative for focal weakness, weakness and numbness.    Physical Exam Updated Vital Signs BP 130/86 (BP Location: Right Arm)   Pulse 91   Temp 98 F (36.7  C) (Oral)   Resp 16   Ht 5\' 11"  (1.803 m)   Wt 95.3 kg   SpO2 99%   BMI 29.29 kg/m   Physical Exam Vitals and nursing note reviewed.  Constitutional:      General: He is not in acute distress.    Appearance: He is well-developed. He is not diaphoretic.  HENT:     Head: Normocephalic and atraumatic.  Eyes:     General: No scleral icterus.    Conjunctiva/sclera: Conjunctivae normal.  Cardiovascular:     Rate and Rhythm: Normal rate and regular rhythm.     Heart sounds: Normal heart sounds.  Pulmonary:     Effort: Pulmonary effort is normal. No respiratory distress.     Breath sounds: Normal breath sounds.  Abdominal:     Palpations:  Abdomen is soft.     Tenderness: There is no abdominal tenderness.  Musculoskeletal:     Cervical back: Normal range of motion and neck supple.     Comments: LUE exam Inspection- No erythema, swelling, atrophy, hypertrophy, abrasions, +1.5 cm L forearm laceration present Palpation- No TTP, compartments soft ROM- Full ROM about the shoulder, elbow, wrist. Strength- 5/5 AIN/PIN/U NV- SILT M/R/U, +2 Radial +2 ulnar pulse   Skin:    General: Skin is warm and dry.  Neurological:     Mental Status: He is alert.  Psychiatric:        Behavior: Behavior normal.     ED Results / Procedures / Treatments   Labs (all labs ordered are listed, but only abnormal results are displayed) Labs Reviewed - No data to display  EKG None  Radiology No results found.  Procedures .Marland KitchenLaceration Repair  Date/Time: 01/13/2020 3:18 PM Performed by: Margarita Mail, PA-C Authorized by: Margarita Mail, PA-C   Consent:    Consent obtained:  Verbal   Consent given by:  Patient   Risks discussed:  Infection, need for additional repair, pain, poor cosmetic result and poor wound healing   Alternatives discussed:  No treatment and delayed treatment Universal protocol:    Procedure explained and questions answered to patient or proxy's satisfaction: yes     Relevant documents present and verified: yes     Test results available and properly labeled: yes     Imaging studies available: yes     Required blood products, implants, devices, and special equipment available: yes     Site/side marked: yes     Immediately prior to procedure, a time out was called: yes     Patient identity confirmed:  Verbally with patient Anesthesia (see MAR for exact dosages):    Anesthesia method:  Local infiltration Repair type:    Repair type:  Simple Pre-procedure details:    Preparation:  Patient was prepped and draped in usual sterile fashion Exploration:    Wound exploration: wound explored through full range of  motion and entire depth of wound probed and visualized   Treatment:    Area cleansed with:  Betadine   Amount of cleaning:  Standard   Irrigation solution:  Sterile water   Irrigation method:  Pressure wash Skin repair:    Repair method:  Sutures   Suture size:  5-0   Suture material:  Nylon   Suture technique:  Simple interrupted   Number of sutures:  2 Approximation:    Approximation:  Close Post-procedure details:    Dressing:  Non-adherent dressing   Patient tolerance of procedure:  Tolerated well, no immediate complications   (  including critical care time)  Medications Ordered in ED Medications  lidocaine (PF) (XYLOCAINE) 1 % injection 5 mL (has no administration in time range)  Tdap (BOOSTRIX) injection 0.5 mL (0.5 mLs Intramuscular Given 01/13/20 1427)    ED Course  I have reviewed the triage vital signs and the nursing notes.  Pertinent labs & imaging results that were available during my care of the patient were reviewed by me and considered in my medical decision making (see chart for details).    MDM Rules/Calculators/A&P                      Tdap booster given.Pressure irrigation performed. Laceration occurred < 8 hours prior to repair which was well tolerated. Pt has no co morbidities to effect normal wound healing. No evidence of vascular, tendon, or nerve injury. Discussed suture home care w pt and answered questions. Pt to f-u for wound check and suture removal in 7 days. Pt is hemodynamically stable w no complaints prior to dc.    Final Clinical Impression(s) / ED Diagnoses Final diagnoses:  Laceration of left forearm, initial encounter    Rx / DC Orders ED Discharge Orders    None       Arthor Captain, PA-C 01/13/20 1519    Pricilla Loveless, MD 01/14/20 [id number]

## 2020-01-13 NOTE — Discharge Instructions (Addendum)
